# Patient Record
Sex: Female | Born: 1985 | Race: White | Hispanic: No | Marital: Married | Smoking: Never smoker
Health system: Southern US, Community
[De-identification: ages and names within clinical notes are randomized; demographics above are authoritative.]

## PROBLEM LIST (undated history)

## (undated) DIAGNOSIS — E119 Type 2 diabetes mellitus without complications: Secondary | ICD-10-CM

## (undated) DIAGNOSIS — J45909 Unspecified asthma, uncomplicated: Secondary | ICD-10-CM

## (undated) DIAGNOSIS — D649 Anemia, unspecified: Secondary | ICD-10-CM

## (undated) DIAGNOSIS — E559 Vitamin D deficiency, unspecified: Secondary | ICD-10-CM

## (undated) DIAGNOSIS — H547 Unspecified visual loss: Secondary | ICD-10-CM

## (undated) DIAGNOSIS — Z331 Pregnant state, incidental: Secondary | ICD-10-CM

## (undated) DIAGNOSIS — J302 Other seasonal allergic rhinitis: Secondary | ICD-10-CM

## (undated) DIAGNOSIS — R112 Nausea with vomiting, unspecified: Secondary | ICD-10-CM

## (undated) HISTORY — PX: APPENDECTOMY: SHX54

## (undated) HISTORY — PX: WISDOM TOOTH EXTRACTION: SHX21

## (undated) HISTORY — DX: Other seasonal allergic rhinitis: J30.2

## (undated) HISTORY — DX: Nausea with vomiting, unspecified: R11.2

## (undated) HISTORY — DX: Anemia, unspecified: D64.9

---
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
## 2005-01-01 ENCOUNTER — Emergency Department (HOSPITAL_COMMUNITY): Admission: EM | Admit: 2005-01-01 | Discharge: 2005-01-01 | Payer: Self-pay | Admitting: Emergency Medicine

## 2005-10-16 HISTORY — PX: OTHER SURGICAL HISTORY: SHX169

## 2010-10-16 DIAGNOSIS — F53 Postpartum depression: Secondary | ICD-10-CM

## 2010-10-16 DIAGNOSIS — Z9289 Personal history of other medical treatment: Secondary | ICD-10-CM

## 2010-10-16 HISTORY — DX: Postpartum depression: F53.0

## 2010-10-16 HISTORY — DX: Personal history of other medical treatment: Z92.89

## 2013-10-16 DIAGNOSIS — J45990 Exercise induced bronchospasm: Secondary | ICD-10-CM

## 2013-10-16 DIAGNOSIS — R51 Headache: Secondary | ICD-10-CM

## 2013-10-16 DIAGNOSIS — L309 Dermatitis, unspecified: Secondary | ICD-10-CM

## 2013-10-16 HISTORY — DX: Exercise induced bronchospasm: J45.990

## 2013-10-16 HISTORY — DX: Dermatitis, unspecified: L30.9

## 2013-10-16 HISTORY — DX: Headache: R51

## 2013-10-16 NOTE — L&D Delivery Note (Signed)
Delivery Information for  Boy Kinzlee Selvy    Labor Events:    Preterm labor:     Cervical ripening:                         Rupture date:     Rupture time:     Rupture type: Intact   Fluid Color:     Fluid Odor         Risk Factors:                                             Delivery Complications:                                                 Vaginal Delivery Counts    Initial Count Correct?:      Next Count Correct?:      Relief Count Correct?:      Other Count Correct?:     Final Count Correct?:          Presentation/Position     Vertex                   Delivery (Newborn)    FHR in O.R.: 142     Delivery Rm Temp:     Date of birth: 06/30/2015   Time of birth: 9:52 AM   Sex: female   GA: Gestational Age: [redacted]w[redacted]d   Delivery Type: Cesarean         C/S Incidence:  Repeat      C/S Incision Type:  Lower Uterine Transverse      C/S Incision Type Other:  uterine inc: 0950      VBAC Attempted:  No      VBAC Successful:  No     Delivery Assist by:     Number of pulls:     Vacuum Type:     Forceps Type:         Delivery Maneuvers    Non-Dystocia Maneuver:                            :         Delivery (Maternal)    Episiotomy: None   Lacerations: None     Laceration Type:      Episiotomy/Laceration Repair: N/A   Blood Loss (mL):     Uterus Explored:     Anesthesia Method: Spinal            Newborn Assessment    Living at birth: Yes          APGARS  One minute Five minutes Ten minutes   Skin color: 1   1       Heart rate: 2   2       Grimace: 2   2       Muscle tone: 2   2       Breathing: 2   2       Totals: 9  9              Resuscitation    Resuscitation: Tactile Stimulation  Suction: Bulb     Prior to DEL of chest: No         Newborn Measurements    Weight: 8 lb 5.3 oz (3780 g)   Length: 20.25"   Observed Anomalies:        Cord Information    Vessels: 3 vessels   Complications: None   Nuchal Cord:     Cut before DEL of shoulder:     Blood gases sent? No   Cord Tissue Sample: No         Placenta Information     Placenta Removal: Manual removal     Placenta Appearance: Intact       Culture/Pathology    Cultures obtained: None          Pathology Specimens: None                    Delivery Information for  Boy Cinde Ebert    Labor Events:    Preterm labor:     Cervical ripening:                         Rupture date:     Rupture time:     Rupture type: Intact   Fluid Color:     Fluid Odor         Risk Factors:                                             Delivery Complications:                                                 Vaginal Delivery Counts    Initial Count Correct?:      Next Count Correct?:      Relief Count Correct?:      Other Count Correct?:     Final Count Correct?:          Presentation/Position     Vertex                   Delivery (Newborn)    FHR in O.R.: 142     Delivery Rm Temp:     Date of birth: 06/30/2015   Time of birth: 9:52 AM   Sex: female   GA: Gestational Age: [redacted]w[redacted]d   Delivery Type: Cesarean         C/S Incidence:  Repeat      C/S Incision Type:  Lower Uterine Transverse      C/S Incision Type Other:  uterine inc: 0950      VBAC Attempted:  No      VBAC Successful:  No     Delivery Assist by:     Number of pulls:     Vacuum Type:     Forceps Type:         Delivery Maneuvers    Non-Dystocia Maneuver:                            :         Delivery (Maternal)    Episiotomy: None   Lacerations: None     Laceration Type:  Episiotomy/Laceration Repair: N/A   Blood Loss (mL):     Uterus Explored:     Anesthesia Method: Spinal            Newborn Assessment    Living at birth: Yes          APGARS  One minute Five minutes Ten minutes   Skin color: 1   1       Heart rate: 2   2       Grimace: 2   2       Muscle tone: 2   2       Breathing: 2   2       Totals: 9  9              Resuscitation    Resuscitation: Tactile Stimulation          Suction: Bulb     Prior to DEL of chest: No         Newborn Measurements    Weight: 8 lb 5.3 oz (3780 g)   Length: 20.25"   Observed Anomalies:        Cord Information     Vessels: 3 vessels   Complications: None   Nuchal Cord:     Cut before DEL of shoulder:     Blood gases sent? No   Cord Tissue Sample: No         Placenta Information    Placenta Removal: Manual removal     Placenta Appearance: Intact       Culture/Pathology    Cultures obtained: None          Pathology Specimens: None

## 2014-04-13 LAB — RUBELLA ANTIBODY, IGG: Rubella AB, IgG: IMMUNE

## 2014-04-13 LAB — PLATELET COUNT: Platelets: 216 (ref 140–400)

## 2014-04-13 LAB — HEMOGLOBIN AND HEMATOCRIT, BLOOD
Hematocrit: 33.7 % — AB (ref 36.0–46.0)
Hgb: 11.3 — AB (ref 11.5–16.0)

## 2014-05-13 ENCOUNTER — Encounter (HOSPITAL_BASED_OUTPATIENT_CLINIC_OR_DEPARTMENT_OTHER): Payer: Self-pay

## 2014-05-18 ENCOUNTER — Other Ambulatory Visit (HOSPITAL_BASED_OUTPATIENT_CLINIC_OR_DEPARTMENT_OTHER): Payer: Self-pay | Admitting: Obstetrics & Gynecology

## 2014-05-18 DIAGNOSIS — O09899 Supervision of other high risk pregnancies, unspecified trimester: Secondary | ICD-10-CM

## 2014-06-01 ENCOUNTER — Institutional Professional Consult (permissible substitution) (HOSPITAL_BASED_OUTPATIENT_CLINIC_OR_DEPARTMENT_OTHER)

## 2014-06-01 ENCOUNTER — Institutional Professional Consult (permissible substitution): Payer: Self-pay | Admitting: Obstetrics & Gynecology

## 2014-06-01 ENCOUNTER — Inpatient Hospital Stay (HOSPITAL_BASED_OUTPATIENT_CLINIC_OR_DEPARTMENT_OTHER): Admission: RE | Admit: 2014-06-01 | Source: Ambulatory Visit

## 2014-06-09 LAB — GROUP B STREP TRANSCRIBED: GBS Transcribed: NEGATIVE

## 2014-06-26 ENCOUNTER — Ambulatory Visit (HOSPITAL_BASED_OUTPATIENT_CLINIC_OR_DEPARTMENT_OTHER): Payer: Self-pay

## 2014-06-29 ENCOUNTER — Encounter (HOSPITAL_BASED_OUTPATIENT_CLINIC_OR_DEPARTMENT_OTHER): Payer: Self-pay

## 2014-06-30 ENCOUNTER — Encounter (HOSPITAL_BASED_OUTPATIENT_CLINIC_OR_DEPARTMENT_OTHER)
Admission: RE | Disposition: A | Discharge status: Home / Self Care | Payer: Self-pay | Source: Ambulatory Visit | Attending: Obstetrics & Gynecology

## 2014-06-30 ENCOUNTER — Inpatient Hospital Stay (HOSPITAL_BASED_OUTPATIENT_CLINIC_OR_DEPARTMENT_OTHER): Admitting: Obstetrics & Gynecology

## 2014-06-30 ENCOUNTER — Inpatient Hospital Stay (HOSPITAL_BASED_OUTPATIENT_CLINIC_OR_DEPARTMENT_OTHER): Admitting: Anesthesiology

## 2014-06-30 ENCOUNTER — Inpatient Hospital Stay
Admission: RE | Admit: 2014-06-30 | Discharge: 2014-07-03 | DRG: 765 | Disposition: A | Discharge status: Home / Self Care | Source: Ambulatory Visit | Attending: Obstetrics & Gynecology | Admitting: Obstetrics & Gynecology

## 2014-06-30 ENCOUNTER — Encounter (HOSPITAL_BASED_OUTPATIENT_CLINIC_OR_DEPARTMENT_OTHER): Payer: Self-pay

## 2014-06-30 DIAGNOSIS — Z794 Long term (current) use of insulin: Secondary | ICD-10-CM

## 2014-06-30 DIAGNOSIS — Z3A38 38 weeks gestation of pregnancy: Secondary | ICD-10-CM

## 2014-06-30 DIAGNOSIS — J45990 Exercise induced bronchospasm: Secondary | ICD-10-CM | POA: Diagnosis present

## 2014-06-30 DIAGNOSIS — O34219 Maternal care for unspecified type scar from previous cesarean delivery: Principal | ICD-10-CM | POA: Diagnosis present

## 2014-06-30 DIAGNOSIS — E109 Type 1 diabetes mellitus without complications: Secondary | ICD-10-CM | POA: Diagnosis present

## 2014-06-30 DIAGNOSIS — O409XX0 Polyhydramnios, unspecified trimester, not applicable or unspecified: Secondary | ICD-10-CM | POA: Diagnosis present

## 2014-06-30 DIAGNOSIS — O2432 Unspecified pre-existing diabetes mellitus in childbirth: Secondary | ICD-10-CM

## 2014-06-30 DIAGNOSIS — O99892 Other specified diseases and conditions complicating childbirth: Secondary | ICD-10-CM | POA: Diagnosis present

## 2014-06-30 DIAGNOSIS — Z9641 Presence of insulin pump (external) (internal): Secondary | ICD-10-CM

## 2014-06-30 HISTORY — DX: Unspecified visual loss: H54.7

## 2014-06-30 HISTORY — DX: Pregnant state, incidental: Z33.1

## 2014-06-30 LAB — GLUCOSE WHOLE BLOOD - POCT
Whole Blood Glucose POCT: 129 mg/dL — ABNORMAL HIGH (ref 70–100)
Whole Blood Glucose POCT: 123 mg/dL — ABNORMAL HIGH (ref 70–100)

## 2014-06-30 LAB — CBC
Hematocrit: 34.6 % — ABNORMAL LOW (ref 37.0–47.0)
Hgb: 11.5 g/dL — ABNORMAL LOW (ref 12.0–16.0)
MCH: 30 pg (ref 28.0–32.0)
MCHC: 33.2 g/dL (ref 32.0–36.0)
MCV: 90.3 fL (ref 80.0–100.0)
MPV: 9.3 fL — ABNORMAL LOW (ref 9.4–12.3)
Nucleated RBC: 0 /100 WBC (ref 0–1)
Platelets: 185 10*3/uL (ref 140–400)
RBC: 3.83 10*6/uL — ABNORMAL LOW (ref 4.20–5.40)
RDW: 14 % (ref 12–15)
WBC: 7.17 10*3/uL (ref 3.50–10.80)

## 2014-06-30 LAB — TYPE AND SCREEN
AB Screen Gel: NEGATIVE
ABO Rh: A POS

## 2014-06-30 LAB — HEPATITIS B SURFACE ANTIGEN W/ REFLEX TO CONFIRMATION: Hepatitis B Surface Antigen: NONREACTIVE

## 2014-06-30 LAB — HEMOLYSIS INDEX: Hemolysis Index: 6 (ref 0–18)

## 2014-06-30 SURGERY — Surgical Case
Anesthesia: Regional | Site: Abdomen | Wound class: Clean Contaminated

## 2014-06-30 MED ORDER — MEASLES, MUMPS & RUBELLA VAC SC INJ
0.5000 mL | INJECTION | SUBCUTANEOUS | Status: DC | PRN
Start: 2014-06-30 — End: 2014-07-03

## 2014-06-30 MED ORDER — LACTATED RINGERS IV SOLN
INTRAVENOUS | Status: DC
Start: 2014-06-30 — End: 2014-07-01

## 2014-06-30 MED ORDER — ALUM & MAG HYDROXIDE-SIMETH 200-200-20 MG/5ML PO SUSP
30.0000 mL | Freq: Four times a day (QID) | ORAL | Status: DC | PRN
Start: 2014-06-30 — End: 2014-07-03

## 2014-06-30 MED ORDER — GLYCOPYRROLATE 0.2 MG/ML IJ SOLN
INTRAMUSCULAR | Status: AC
Start: 2014-06-30 — End: ?
  Filled 2014-06-30: qty 1

## 2014-06-30 MED ORDER — OXYCODONE-ACETAMINOPHEN 5-325 MG PO TABS
1.0000 | ORAL_TABLET | ORAL | Status: DC | PRN
Start: 2014-06-30 — End: 2014-07-03

## 2014-06-30 MED ORDER — MORPHINE SULFATE (PF) 1 MG/ML IJ SOLN
INTRAMUSCULAR | Status: AC
Start: 2014-06-30 — End: ?
  Filled 2014-06-30: qty 10

## 2014-06-30 MED ORDER — NALBUPHINE HCL 10 MG/ML IJ SOLN
INTRAMUSCULAR | Status: AC
Start: 2014-06-30 — End: ?
  Filled 2014-06-30: qty 1

## 2014-06-30 MED ORDER — EPHEDRINE SULFATE 50 MG/ML IJ SOLN
INTRAMUSCULAR | Status: AC
Start: 2014-06-30 — End: ?
  Filled 2014-06-30: qty 1

## 2014-06-30 MED ORDER — LANOLIN EX OINT
TOPICAL_OINTMENT | CUTANEOUS | Status: DC | PRN
Start: 2014-06-30 — End: 2014-07-03
  Filled 2014-06-30: qty 7

## 2014-06-30 MED ORDER — MORPHINE SULFATE (PF) 1 MG/ML IJ SOLN
INTRAMUSCULAR | Status: DC | PRN
Start: 2014-06-30 — End: 2014-06-30
  Administered 2014-06-30: 2.1 mL via INTRASPINAL

## 2014-06-30 MED ORDER — DOCUSATE SODIUM 100 MG PO CAPS
200.0000 mg | ORAL_CAPSULE | Freq: Two times a day (BID) | ORAL | Status: DC | PRN
Start: 2014-06-30 — End: 2014-07-03
  Administered 2014-06-30 – 2014-07-01 (×2): 200 mg via ORAL
  Filled 2014-06-30 (×4): qty 2

## 2014-06-30 MED ORDER — FENTANYL CITRATE 0.05 MG/ML IJ SOLN
25.0000 ug | INTRAMUSCULAR | Status: DC | PRN
Start: 2014-06-30 — End: 2014-06-30

## 2014-06-30 MED ORDER — OXYTOCIN 30UNITS IN LR 500 ML PP--OUTSOURCED
7.5000 [IU]/h | INTRAVENOUS | Status: AC
Start: 2014-06-30 — End: 2014-06-30

## 2014-06-30 MED ORDER — TETANUS-DIPHTH-ACELL PERTUSSIS 5-2.5-18.5 LF-MCG/0.5 IM SUSP
0.5000 mL | INTRAMUSCULAR | Status: DC | PRN
Start: 2014-06-30 — End: 2014-07-03

## 2014-06-30 MED ORDER — SODIUM CHLORIDE 0.9 % IJ SOLN
3.0000 mL | Freq: Three times a day (TID) | INTRAMUSCULAR | Status: DC
Start: 2014-07-02 — End: 2014-07-01

## 2014-06-30 MED ORDER — NALBUPHINE HCL 10 MG/ML IJ SOLN
5.0000 mg | INTRAMUSCULAR | Status: DC | PRN
Start: 2014-06-30 — End: 2014-07-03
  Administered 2014-06-30 (×2): 5 mg via SUBCUTANEOUS
  Filled 2014-06-30: qty 1

## 2014-06-30 MED ORDER — MEPERIDINE HCL 25 MG/ML IJ SOLN
12.5000 mg | INTRAMUSCULAR | Status: DC | PRN
Start: 2014-06-30 — End: 2014-06-30

## 2014-06-30 MED ORDER — IBUPROFEN 600 MG PO TABS
600.0000 mg | ORAL_TABLET | Freq: Four times a day (QID) | ORAL | Status: DC | PRN
Start: 2014-06-30 — End: 2014-07-03
  Filled 2014-06-30: qty 1

## 2014-06-30 MED ORDER — LACTATED RINGERS IV SOLN
125.0000 mL/h | INTRAVENOUS | Status: DC
Start: 2014-06-30 — End: 2014-07-01
  Administered 2014-06-30: 125 mL/h via INTRAVENOUS

## 2014-06-30 MED ORDER — HYDROMORPHONE HCL PF 1 MG/ML IJ SOLN
0.5000 mg | INTRAMUSCULAR | Status: DC | PRN
Start: 2014-06-30 — End: 2014-06-30

## 2014-06-30 MED ORDER — MISOPROSTOL 200 MCG PO TABS
800.0000 ug | ORAL_TABLET | Freq: Once | ORAL | Status: DC | PRN
Start: 2014-06-30 — End: 2014-06-30

## 2014-06-30 MED ORDER — CEFAZOLIN SODIUM-DEXTROSE 2-3 GM-% IV SOLR
INTRAVENOUS | Status: AC
Start: 2014-06-30 — End: 2014-06-30
  Filled 2014-06-30: qty 50

## 2014-06-30 MED ORDER — ACETAMINOPHEN 650 MG RE SUPP
650.0000 mg | Freq: Four times a day (QID) | RECTAL | Status: DC | PRN
Start: 2014-06-30 — End: 2014-07-03

## 2014-06-30 MED ORDER — FAMOTIDINE 10 MG/ML IV SOLN (WRAP)
INTRAVENOUS | Status: DC | PRN
Start: 2014-06-30 — End: 2014-06-30
  Administered 2014-06-30: 20 mg via INTRAVENOUS

## 2014-06-30 MED ORDER — EPHEDRINE SULFATE 50 MG/ML IJ SOLN
INTRAMUSCULAR | Status: DC | PRN
Start: 2014-06-30 — End: 2014-06-30
  Administered 2014-06-30 (×2): 10 mg via INTRAVENOUS

## 2014-06-30 MED ORDER — ON-Q PUMP SINGLE FLOW
4.0000 mL/h | Status: DC
Start: 2014-06-30 — End: 2014-07-03
  Administered 2014-06-30: 4 mL/h via PERCUTANEOUS
  Filled 2014-06-30: qty 330

## 2014-06-30 MED ORDER — CEFAZOLIN SODIUM-DEXTROSE 2-3 GM-% IV SOLR
2.0000 g | Freq: Three times a day (TID) | INTRAVENOUS | Status: DC
Start: 2014-06-30 — End: 2014-06-30
  Administered 2014-06-30: 2 g via INTRAVENOUS

## 2014-06-30 MED ORDER — LACTATED RINGERS IV SOLN
INTRAVENOUS | Status: DC | PRN
Start: 2014-06-30 — End: 2014-06-30

## 2014-06-30 MED ORDER — ONDANSETRON HCL 4 MG/2ML IJ SOLN
INTRAMUSCULAR | Status: AC
Start: 2014-06-30 — End: ?
  Filled 2014-06-30: qty 2

## 2014-06-30 MED ORDER — OXYTOCIN 10 UNIT/ML IJ SOLN
INTRAMUSCULAR | Status: AC
Start: 2014-06-30 — End: ?
  Filled 2014-06-30: qty 2

## 2014-06-30 MED ORDER — ACETAMINOPHEN 325 MG PO TABS
325.0000 mg | ORAL_TABLET | ORAL | Status: DC | PRN
Start: 2014-06-30 — End: 2014-07-03

## 2014-06-30 MED ORDER — PROMETHAZINE HCL 25 MG/ML IJ SOLN
3.1250 mg | Freq: Once | INTRAMUSCULAR | Status: AC | PRN
Start: 2014-06-30 — End: 2014-06-30
  Administered 2014-06-30: 3.125 mg via INTRAVENOUS

## 2014-06-30 MED ORDER — HYDROCORTISONE 1 % EX OINT
TOPICAL_OINTMENT | Freq: Three times a day (TID) | CUTANEOUS | Status: DC | PRN
Start: 2014-06-30 — End: 2014-07-03

## 2014-06-30 MED ORDER — WITCH HAZEL-GLYCERIN EX PADS
1.0000 | MEDICATED_PAD | CUTANEOUS | Status: DC | PRN
Start: 2014-06-30 — End: 2014-07-03

## 2014-06-30 MED ORDER — ONDANSETRON 4 MG PO TBDP
4.0000 mg | ORAL_TABLET | Freq: Four times a day (QID) | ORAL | Status: DC | PRN
Start: 2014-06-30 — End: 2014-07-03

## 2014-06-30 MED ORDER — PRENATAL AD PO TABS
1.0000 | ORAL_TABLET | Freq: Every day | ORAL | Status: DC
Start: 2014-06-30 — End: 2014-07-03
  Filled 2014-06-30 (×2): qty 1

## 2014-06-30 MED ORDER — OXYCODONE HCL 5 MG PO TABS
5.0000 mg | ORAL_TABLET | Freq: Four times a day (QID) | ORAL | Status: DC | PRN
Start: 2014-06-30 — End: 2014-07-03
  Administered 2014-06-30: 5 mg via ORAL

## 2014-06-30 MED ORDER — FENTANYL CITRATE 0.05 MG/ML IJ SOLN
INTRAMUSCULAR | Status: AC
Start: 2014-06-30 — End: ?
  Filled 2014-06-30: qty 2

## 2014-06-30 MED ORDER — ACETAMINOPHEN 325 MG PO TABS
650.0000 mg | ORAL_TABLET | Freq: Four times a day (QID) | ORAL | Status: DC | PRN
Start: 2014-06-30 — End: 2014-07-03

## 2014-06-30 MED ORDER — METHYLERGONOVINE MALEATE 0.2 MG/ML IJ SOLN
200.0000 ug | INTRAMUSCULAR | Status: DC | PRN
Start: 2014-06-30 — End: 2014-06-30

## 2014-06-30 MED ORDER — SIMETHICONE 80 MG PO CHEW
160.0000 mg | CHEWABLE_TABLET | Freq: Three times a day (TID) | ORAL | Status: DC | PRN
Start: 2014-06-30 — End: 2014-07-03

## 2014-06-30 MED ORDER — ONDANSETRON HCL 4 MG/2ML IJ SOLN
4.0000 mg | Freq: Four times a day (QID) | INTRAMUSCULAR | Status: DC | PRN
Start: 2014-06-30 — End: 2014-07-03

## 2014-06-30 MED ORDER — LACTATED RINGERS IV BOLUS
1000.0000 mL | Freq: Once | INTRAVENOUS | Status: AC
Start: 2014-06-30 — End: 2014-06-30
  Administered 2014-06-30: 1000 mL via INTRAVENOUS

## 2014-06-30 MED ORDER — ONDANSETRON HCL 4 MG/2ML IJ SOLN
4.0000 mg | Freq: Once | INTRAMUSCULAR | Status: AC | PRN
Start: 2014-06-30 — End: 2014-06-30
  Administered 2014-06-30: 4 mg via INTRAVENOUS

## 2014-06-30 MED ORDER — NALOXONE HCL 0.4 MG/ML IJ SOLN
0.1000 mg | INTRAMUSCULAR | Status: DC | PRN
Start: 2014-06-30 — End: 2014-07-03

## 2014-06-30 MED ORDER — IBUPROFEN 600 MG PO TABS
600.0000 mg | ORAL_TABLET | Freq: Four times a day (QID) | ORAL | Status: DC
Start: 2014-06-30 — End: 2014-07-03
  Administered 2014-06-30 – 2014-07-03 (×14): 600 mg via ORAL
  Filled 2014-06-30 (×14): qty 1

## 2014-06-30 MED ORDER — OXYCODONE HCL 5 MG PO TABS
ORAL_TABLET | ORAL | Status: AC
Start: 2014-06-30 — End: ?
  Filled 2014-06-30: qty 1

## 2014-06-30 MED ORDER — OXYTOCIN 30UNITS IN LR 500 ML PP--OUTSOURCED
INTRAVENOUS | Status: AC
Start: 2014-06-30 — End: ?
  Filled 2014-06-30: qty 500

## 2014-06-30 MED ORDER — OXYCODONE-ACETAMINOPHEN 5-325 MG PO TABS
2.0000 | ORAL_TABLET | ORAL | Status: DC | PRN
Start: 2014-06-30 — End: 2014-07-03

## 2014-06-30 MED ORDER — PHENYLEPHRINE HCL 10 MG/ML IJ SOLN
INTRAMUSCULAR | Status: DC | PRN
Start: 2014-06-30 — End: 2014-06-30
  Administered 2014-06-30 (×2): 100 ug via INTRAVENOUS

## 2014-06-30 MED ORDER — BENZOCAINE-MENTHOL 20-0.5 % EX AERO
1.0000 | INHALATION_SPRAY | CUTANEOUS | Status: DC | PRN
Start: 2014-06-30 — End: 2014-07-03

## 2014-06-30 MED ORDER — OXYTOCIN 30UNITS IN LR 500 ML PP--OUTSOURCED
7.5000 [IU]/h | INTRAVENOUS | Status: AC
Start: 2014-06-30 — End: 2014-06-30
  Administered 2014-06-30: 7.5 [IU]/h via INTRAVENOUS

## 2014-06-30 MED ORDER — MISOPROSTOL 200 MCG PO TABS
ORAL_TABLET | ORAL | Status: DC | PRN
Start: 2014-06-30 — End: 2014-06-30
  Administered 2014-06-30: 800 ug via INTRAUTERINE

## 2014-06-30 MED ORDER — PROMETHAZINE HCL 25 MG/ML IJ SOLN
INTRAMUSCULAR | Status: AC
Start: 2014-06-30 — End: ?
  Filled 2014-06-30: qty 1

## 2014-06-30 MED ORDER — OXYTOCIN 10 UNIT/ML IJ SOLN
INTRAMUSCULAR | Status: DC | PRN
Start: 2014-06-30 — End: 2014-06-30
  Administered 2014-06-30: 20 [IU] via INTRAVENOUS

## 2014-06-30 MED ORDER — BISACODYL 10 MG RE SUPP
10.0000 mg | Freq: Every day | RECTAL | Status: DC | PRN
Start: 2014-06-30 — End: 2014-07-03

## 2014-06-30 MED ORDER — FAMOTIDINE 20 MG/2ML IV SOLN
INTRAVENOUS | Status: AC
Start: 2014-06-30 — End: ?
  Filled 2014-06-30: qty 2

## 2014-06-30 MED ORDER — ONDANSETRON HCL 4 MG/2ML IJ SOLN
INTRAMUSCULAR | Status: DC | PRN
Start: 2014-06-30 — End: 2014-06-30
  Administered 2014-06-30: 4 mg via INTRAVENOUS

## 2014-06-30 MED ORDER — BUPIVACAINE HCL (PF) 0.5 % IJ SOLN
INTRAMUSCULAR | Status: AC
Start: 2014-06-30 — End: 2014-06-30
  Filled 2014-06-30: qty 10

## 2014-06-30 MED ORDER — METHYLERGONOVINE MALEATE 0.2 MG/ML IJ SOLN
200.0000 ug | Freq: Once | INTRAMUSCULAR | Status: DC | PRN
Start: 2014-06-30 — End: 2014-06-30

## 2014-06-30 SURGICAL SUPPLY — 15 items
CATH ON-Q EXPANSION 19.1CM (Catheter) ×1 IMPLANT
CLEANER ELECTROSURGICAL TIP PENCIL (Procedure Accessories) ×1
CLEANER ELECTROSURGICAL TIP PENCIL HANDSWITCH LECTROBRASIVE (Procedure Accessories) ×1 IMPLANT
CLEANER ESURG TIP PNCL LCTRBRS STRL (Procedure Accessories) ×1
DRESSING ISLAND TELFA 4X10 (Dressing) ×3 IMPLANT
GLOVE SURG BIOGEL PF LTX SZ7.0 (Glove) ×3 IMPLANT
MARKER SKIN (Positioning Supplies) ×3 IMPLANT
PAD ELECTROSRG GRND REM W CRD (Procedure Accessories) ×3 IMPLANT
PAD VALLEYLAB SCRATCH 5.08CM (Procedure Accessories) ×1
PENCIL ELECTRO PUSH BUTTON (Cautery) ×3 IMPLANT
SLEEVE CMPR MED KN LGTH KDL SCD 21- IN (Sleeve) ×2
SLEEVE COMPRESSION MEDIUM KNEE LENGTH KENDALL SEQUENTIAL OD21- IN (Sleeve) ×1 IMPLANT
SLEEVE SEQ COMP KNEE REGULAR (Sleeve) ×1
SUT MONOCRYL O CTX (Suture) ×6 IMPLANT
SUTURE VICRYL 0-0 CT1 (Suture) ×6 IMPLANT

## 2014-06-30 NOTE — Anesthesia Preprocedure Evaluation (Addendum)
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      pertinent labs reviewed

## 2014-06-30 NOTE — Plan of Care (Signed)
Problem: C-Section Pre/Intra-Op/Recovery Care  Goal: Tissue perfusion is adequate  Outcome: Progressing

## 2014-06-30 NOTE — Plan of Care (Signed)
Problem: C-Section Pre/Intra-Op/Recovery Care  Goal: Dressing intact during recovery with any drainage marked.  Outcome: Progressing

## 2014-06-30 NOTE — Plan of Care (Signed)
Problem: C-Section Pre/Intra-Op/Recovery Care  Goal: Manages discomfort.  Outcome: Progressing

## 2014-06-30 NOTE — PACU (Signed)
Patient reattached insulin pump.

## 2014-06-30 NOTE — Plan of Care (Signed)
Problem: C-Section Pre/Intra-Op/Recovery Care  Goal: Postpartum hemorrhage is prevented or managed promptly  Outcome: Progressing    Problem: Cesarean and Vaginal Delivery-Postpartum Care  Goal: Postpartum hemorrhage is prevented or managed promptly  Outcome: Progressing

## 2014-06-30 NOTE — Plan of Care (Signed)
Problem: C-Section Pre/Intra-Op/Recovery Care  Goal: Airway is maintained  Outcome: Progressing

## 2014-06-30 NOTE — Plan of Care (Signed)
Problem: C-Section Pre/Intra-Op/Recovery Care  Goal: Patient will maintain Adequate Oxygenation  Outcome: Progressing

## 2014-06-30 NOTE — Plan of Care (Signed)
Routine c/s reviewed with patient and husband.  No questions at this time.

## 2014-06-30 NOTE — Transfer of Care (Signed)
Anesthesia Transfer of Care Note    Patient: Allison Salazar    Procedures performed: Procedure(s):  CESAREAN SECTION    Anesthesia type: Spinal    Patient location:PACU    Last vitals:   Filed Vitals:    06/30/14 0832   BP: 113/67   Pulse: 70   Temp:        Post pain: Patient not complaining of pain, continue current therapy      Mental Status:awake    Respiratory Function: tolerating room air    Cardiovascular: stable    Nausea/Vomiting: patient not complaining of nausea or vomiting    Hydration Status: adequate    Post assessment: no apparent anesthetic complications and no reportable events     Report given and care assumed by PACU RN, questions addressed.  Patient stable. BP 108/61, HR 85, Temp 97.7, SpO2 99%, RR 16    Dr. Joselyn Glassman [phone]

## 2014-06-30 NOTE — Op Note (Signed)
Cesarean Section Procedure Note    Indications: Previous cesarean x 2; IDDM; polyhydramnios    Pre-operative Diagnosis: 38 week 5 day pregnancy.    Post-operative Diagnosis: same    Procedure:  Repeat low segment cesarean section    Surgeon: Loistine Chance     Assistants: Hansel Starling, FSA    Anesthesia: Spinal anesthesia    ASA Class: 3    Procedure Details   The patient was seen in the Holding Room. The risks, benefits, complications, treatment options, and expected outcomes were discussed with the patient.  The patient concurred with the proposed plan, giving informed consent.  The site of surgery properly noted/marked. The patient was taken to Operating Room # H, identified as Allison Salazar and the procedure verified as C-Section Delivery. A Time Out was held and the above information confirmed.    After induction of anesthesia, the patient was draped and prepped in the usual sterile manner. A Pfannenstiel incision was made and carried down through the subcutaneous tissue to the fascia. Fascial incision was made and extended transversely. The fascia was separated from the underlying rectus tissue superiorly and inferiorly. The peritoneum was identified and entered. Peritoneal incision was extended longitudinally. The utero-vesical peritoneal reflection was incised transversely and the bladder flap was bluntly freed from the lower uterine segment. A low transverse uterine incision was made. Delivered from VTX presentation was baby boy   with Apgar scores of 9 at one minute and 9 at five minutes. After the umbilical cord was clamped and cut cord blood was obtained for evaluation. The placenta was removed intact and appeared normal. The uterine outline, tubes and ovaries appeared normal. The uterine incision was closed with running locked sutures of 0 monocryl. Hemostasis was observed. Lavage was carried out until clear. The fascia was then reapproximated with running sutures of 0 Vicryl. The skin was  reapproximated with monocryl.    Instrument, sponge, and needle counts were correct prior the abdominal closure and at the conclusion of the case.     Findings:  Female infant  Apgars 9-9  Wt. : 8 lbs 5 oz  (3780 grams)  Clear fluid  Normal tubes ovaries and uterus    Estimated Blood Loss:  700 ml           Drains: foley to garvity           Total IV Fluids: 1000 ml           Specimens: none           Implants: ON - Q pump           Complications:  None; patient tolerated the procedure well.           Disposition: PACU - hemodynamically stable.           Condition: stable    Attending Attestation: I was present and scrubbed for the entire procedure.

## 2014-06-30 NOTE — Anesthesia Postprocedure Evaluation (Signed)
Anesthesia Post Evaluation    Patient: Allison Salazar    Procedures performed: Procedure(s):  CESAREAN SECTION    Anesthesia type: Spinal    Patient location:Postpartum Ward    Last vitals:   Filed Vitals:    06/30/14 1226   BP: 110/68   Pulse: 82   Temp: 36.7 C (98.1 F)   Resp: 17   SpO2: 100%       Post pain: Patient not complaining of pain, continue current therapy      Mental Status:awake    Respiratory Function: tolerating room air    Cardiovascular: see RN record for VS    Nausea/Vomiting: patient not complaining of nausea or vomiting    Hydration Status: see record for VS    Post assessment: no apparent anesthetic complications and no reportable events     Dr. Joselyn Glassman [phone]

## 2014-06-30 NOTE — H&P (Signed)
Subjective:   Allison Salazar is a 28 y.o. G5 P2 female with EDC [birth year]/09[birth year]2014 at 38 and 5/[redacted] weeks gestation who is being admitted for C-section.  Her current obstetrical history is significant for IDDM and previous cesarean.  Patient reports occasional contractions.   Fetal Movement: normal.   Past Medical History   Diagnosis Date   . Asthma, exercise induced 2015     NO inhaler use 93yrs   . History of blood transfusion 2012     long labor, atony   . Headache(784.0) 2015     occ migraine   . PONV (postoperative nausea and vomiting)    . Postpartum depression 2012     zoloft   . Impaired vision      contacts   . Eczema 2015   . Diabetes mellitus 1989     insulin pump   . Pregnant state, incidental    .        Objective:     Vital signs in last 24 hours:  Temp:  [97.8 F (36.6 C)] 97.8 F (36.6 C)  Heart Rate:  [70-74] 70  BP: (102-113)/(66-67) 113/67 mmHg      General:   alert, appears stated age and cooperative   Skin:   normal   HEENT:  NCAT   Lungs:   clear to auscultation bilaterally   Heart:   regular rate and rhythm   Breasts:   ---   Abdomen:  gravid   Pelvis:  External genitalia: normal general appearance   FHT:  reactive BPM   Uterine Size: size equals dates   Presentations: cephalic   Cervix:     Dilation: Closed    Effacement: 50%    Station:  Floating    Consistency: medium    Position: posterior     Lab Review   A, Rh+, Rubella-immune, Hepatitis B surface antigen non-reactive, GBS negative       Assessment/Plan:   38 and 5/[redacted] weeks gestation.  Not in labor.  Obstetrical history significant for IDDM and previous cesarean .       Risks, benefits, alternatives and possible complications have been discussed in detail with the patient.  Pre-admission, admission, and post admission procedures and expectations were discussed in detail.  All questions answered, all appropriate consents will be signed at the Hospital. Admission is planned for today.   Plan for cesarean now.

## 2014-06-30 NOTE — Plan of Care (Signed)
Problem: C-Section Pre/Intra-Op/Recovery Care  Goal: Vital signs and assessments are within defined parameters.  Outcome: Not Progressing    Problem: Cesarean and Vaginal Delivery-Postpartum Care  Goal: Vital signs and assessments are within defined parameters.  Outcome: Not Progressing

## 2014-07-01 ENCOUNTER — Encounter (HOSPITAL_BASED_OUTPATIENT_CLINIC_OR_DEPARTMENT_OTHER): Payer: Self-pay | Admitting: Obstetrics & Gynecology

## 2014-07-01 LAB — RPR: RPR: NONREACTIVE

## 2014-07-01 LAB — CBC AND DIFFERENTIAL
Basophils Absolute Automated: 0.02 10*3/uL (ref 0.00–0.20)
Basophils Automated: 0 %
Eosinophils Absolute Automated: 0.04 10*3/uL (ref 0.00–0.70)
Eosinophils Automated: 0 %
Hematocrit: 28.2 % — ABNORMAL LOW (ref 37.0–47.0)
Hgb: 9.2 g/dL — ABNORMAL LOW (ref 12.0–16.0)
Immature Granulocytes Absolute: 0.02 10*3/uL
Immature Granulocytes: 0 %
Lymphocytes Absolute Automated: 1.34 10*3/uL (ref 0.50–4.40)
Lymphocytes Automated: 16 %
MCH: 30.1 pg (ref 28.0–32.0)
MCHC: 32.6 g/dL (ref 32.0–36.0)
MCV: 92.2 fL (ref 80.0–100.0)
MPV: 9.5 fL (ref 9.4–12.3)
Monocytes Absolute Automated: 0.59 10*3/uL (ref 0.00–1.20)
Monocytes: 7 %
Neutrophils Absolute: 6.57 10*3/uL (ref 1.80–8.10)
Neutrophils: 77 %
Nucleated RBC: 0 /100 WBC (ref 0–1)
Platelets: 147 10*3/uL (ref 140–400)
RBC: 3.06 10*6/uL — ABNORMAL LOW (ref 4.20–5.40)
RDW: 14 % (ref 12–15)
WBC: 8.58 10*3/uL (ref 3.50–10.80)

## 2014-07-01 NOTE — Plan of Care (Signed)
Pt VSS. Pt has been up OOB and has been ambulating well around her room. Pt has been voiding well since having her foley out. IV has been removed. Bleeding has been WNL. Pt reports some pain but only has been wanting IBuprofen. Pt has had baby rooming in and has been breast feeding well with good reported latch. Pt has been monitoring her own blood sugar levels and has been instructed to report any <50 or >225 which she states she has not had at this time. Pt tolerating diet well. Pt will continue to be monitored for comfort as well as safety.

## 2014-07-01 NOTE — Lactation Note (Signed)
Initial Visit:  Gravida:  5   Para:   3          Delivery type: C/S  DOB and delivery time:[birth year] @ [id number]  Gestational age:29+    Breastfeeding and pumping experience:  1st baby (short frenulum) 16 months, 2nd baby 9 months (until this pregnancy  Initial lactation consult. Breastfeeding basics reviewed.   Encouraged to do skin to skin and breast feed with all cues (at least 8-12 times per 24 hours).   Avoid formula supplement unless it is medically indicated. Watch baby for signs of effective feedings (adequate number of voids, stools and minimal weight loss).   Contact number given - Patient to call for questions or assistance as needed.   Shown breast feeding section of Mother/Infant booklet and given outpatient lactation resource list.   Follow up in am.    No problems or concerns at this time.

## 2014-07-01 NOTE — Progress Notes (Addendum)
..  POD# 1 cesarean delivery.   No complaints   Vitals reviewed and stable..BP 93/57 mmHg  Pulse 82  Temp(Src) 97.6 F (36.4 C) (Oral)  Resp 17  Ht 1.626 m (5\' 4" )  Wt 77.111 kg (170 lb)  BMI 29.17 kg/m2  SpO2 98%  Breastfeeding? Yes   Incision CDI   Abdomen hypoactive bowel sounds   Fundus firm   Decreasing lochia   LE Trace  Routine postpartum care.  D/C plan in 1-2 days if well.

## 2014-07-01 NOTE — Progress Notes (Signed)
Anesthesia Department Obstetric Follow-up    Patient: Allison Salazar    Anesthesia type: Spinal    Post-operative pain managment: Subarachnoid Morphine    Complaints/Side Effects:  Itching--has resolved.    Last vitals:   Filed Vitals:    07/01/14 0929   BP: 93/54   Pulse: 83   Temp: 36.4 C (97.5 F)   Resp: 18   SpO2: 100%        Mental Status:awake and alert     Pain (assessment and plan):  Patient not complaining of pain, continue current therapy     Respiratory Function: tolerating room air    Nausea/Vomiting: patient not complaining of nausea or vomiting    Post assessment: no apparent anesthetic complications

## 2014-07-02 ENCOUNTER — Other Ambulatory Visit: Payer: Self-pay

## 2014-07-02 MED ORDER — OXYCODONE-ACETAMINOPHEN 5-325 MG PO TABS
1.0000 | ORAL_TABLET | ORAL | Status: DC | PRN
Start: 2014-07-02 — End: 2014-07-02

## 2014-07-02 MED ORDER — OXYCODONE HCL 5 MG PO TABS
5.0000 mg | ORAL_TABLET | Freq: Four times a day (QID) | ORAL | 0 refills | Status: DC | PRN
Start: 2014-07-02 — End: 2014-09-23
  Filled 2014-07-02: qty 50, 13d supply, fill #0

## 2014-07-02 MED ORDER — MUPIROCIN 2 % EX OINT
0 refills | Status: DC
Start: 2014-07-02 — End: 2015-08-11
  Filled 2014-07-02: qty 44, 15d supply, fill #0

## 2014-07-02 MED ORDER — IBUPROFEN 600 MG PO TABS
600.0000 mg | ORAL_TABLET | Freq: Four times a day (QID) | ORAL | 0 refills | Status: DC | PRN
Start: 2014-07-02 — End: 2014-09-23
  Filled 2014-07-02: qty 50, 13d supply, fill #0

## 2014-07-02 NOTE — Discharge Summary (Signed)
Obstetrics Discharge Summary    Delivery:  See Delivery Record    Puerperal Course: Normal    Discharge Examination: Normal    Diagnosis:  Term female infant delivered by C Section     Instructions: She has been given a discharge instruction sheet which includes precautions for activity level, follow-up and when to call the physician     Hoyt Leanos, MD  ID: [phone]  Pager: [phone]

## 2014-07-02 NOTE — Progress Notes (Addendum)
Subjective:  Postop Day 2: Cesarean Delivery    The patient feels well.    Pain is well controlled with current medications.  Urinary output is adequate.   The patient is ambulating well.   The patient is tolerating a normal diet.   Flatus has been passed.  Vaginal bleeding reduced.  Desires discharge home     Objective:  Vital signs in last 24 hours:  Temp:  [97.5 F (36.4 C)-98.1 F (36.7 C)] 98.1 F (36.7 C)  Heart Rate:  [83-97] 83  Resp Rate:  [16-18] 16  BP: (93-102)/(54-69) 97/67 mmHg      General:    alert   Bowel Sounds:  active       Uterine Fundus:   firm   Incision:  healing well, no significant drainage, no dehiscence, no significant erythema             Extremities:  non-tender,trace to 1+ bilateral pedal edema     Lab:  A positive  Hemoglobin: PreOp: 11  PostOp:9      Assessment/Plan:  Status post Cesarean section. Doing well postoperatively.   DM: blood sugars stable     Discharge home with standard precautions and return to clinic in 2 weeks        Lorna Few, MD  ID: [phone]  Pager: [phone]

## 2014-07-02 NOTE — Plan of Care (Signed)
Pt VSS. Pt has been up OOB and has been ambulating around the unit well. Pt has been receiving scheduled ibuprofen with good reported management of pain. Bleeding is WNL and U is firm and midline. Husband is at the bedside. Pt has been given new bed and reports much more comfort now. Pt has been breast feeding well with good reported latch today from mom. Baby has been started under photo therapy with all questions answered about treatment. Discharge has been moved until tomorrow. Pt will continue to be monitored for comfort as well as safety throughout the day.

## 2014-07-02 NOTE — Lactation Note (Signed)
Mom states that she has had difficulty getting baby back nursing well since the circumcision yesterday morning. During the night the baby refused to  Latch at all despite being awake and alert. He didn't fuss, but he wouldn't latch. Mom had concerns about baby's jaundice level, and decided to give him one formula feeding. She is pumping with HGP, but nothing is coming out on the left side. She was able to express about 20 ml of EBM from the right side, and she fed it to her baby. This morning, baby has latched and fed well. He is sleeping now. Reassured that baby should return to breast feeding well. Sore nipples - used APNO with last baby and feels it worked very well for her. RN called. She will ask Dr. For prescription for [facility]. Contact # on white board. Call for F/U as needed.

## 2014-07-02 NOTE — Plan of Care (Signed)
Patient is stable and without questions or concerns at this time. POC reviewed this shift and patient verbalizes understanding and continues to progress.   Patient pain level within patient stated goal: YES   Patient breastfeeding infant: YES   Patient pumping: NO   Patient lochia appropriate: YES   Foley out and voiding: yes  Abdominal incision clean, dry and well approximated: yes  Nutrition adequate for discharge: YES   Patient reports constipation: NO   Patient agrees to notify RN of any changes in her condition/status: YES

## 2014-07-03 NOTE — Plan of Care (Signed)
Pt stable, assessment WNL.  Pt discharged to home.  F/U scheduled in 2 weeks with OBGYN.  Discharge teaching complete, pt verbalizes understanding.

## 2014-07-03 NOTE — Progress Notes (Signed)
Patient not in room when rounding   No complaints from nursing staff.   OK for discharge today.    Lorna Few, MD  ID: [phone]  Pager: [phone]

## 2014-07-03 NOTE — Plan of Care (Signed)
Patient is stable and without questions or concerns at this time.  POC reviewed this shift and patient verbalizes understanding and continues to progress.    Patient pain level within patient stated goal: Yes  Patient breastfeeding infant: Yes  Patient pumping: Yes  Patient lochia appropriate: Yes, see doc flow  Foley out and voiding: Yes  Abdominal incision clean, dry and well approximated: Yes  Nutrition adequate for discharge: Yes  Patient reports constipation: No   Patient agrees to notify RN of any changes in her condition/status: Yes    Q-pump removed by MD.

## 2014-07-03 NOTE — Progress Notes (Signed)
Subjective:  Postop Day 3: Cesarean Delivery    The patient feels well.    Pain is well controlled with current medications.  Urinary output is adequate.   The patient is ambulating well.   The patient is tolerating a normal diet.   Flatus has been passed.  Vaginal bleeding reduced.  Desires discharge home     Objective:  Vital signs in last 24 hours:  Temp:  [97.9 F (36.6 C)] 97.9 F (36.6 C)  Heart Rate:  [77] 77  Resp Rate:  [16] 16  BP: (107)/(61) 107/61 mmHg      General:    alert   Bowel Sounds:  active       Uterine Fundus:   firm   Incision:  healing well, no significant drainage, no dehiscence, no significant erythema             Extremities:  non-tender,trace to 1+ bilateral pedal edema     Lab:  A positive  Hemoglobin: PreOp: 11  PostOp:9      Assessment/Plan:  Status post Cesarean section. Doing well postoperatively.   DM: blood sugars stable     Discharge home with standard precautions and return to clinic in 2 weeks        Lorna Few, MD  ID: [phone]  Pager: [phone]

## 2014-07-03 NOTE — Plan of Care (Signed)
Patient is stable and without questions or concerns at this time. POC reviewed this shift and patient verbalizes understanding and continues to progress.   Patient pain level within patient stated goal: YES   Patient breastfeeding infant: YES   Patient pumping: NO   Patient lochia appropriate: YES   Foley out and voiding: yes   Abdominal incision clean, dry and well approximated: yes   Nutrition adequate for discharge: YES   Patient reports constipation: NO   Patient agrees to notify RN of any changes in her condition/status: YES    On q pump intact

## 2014-07-12 ENCOUNTER — Telehealth: Payer: Self-pay | Admitting: Pediatrics

## 2014-09-14 ENCOUNTER — Encounter (INDEPENDENT_AMBULATORY_CARE_PROVIDER_SITE_OTHER): Payer: Self-pay | Admitting: Family Medicine

## 2014-09-14 ENCOUNTER — Ambulatory Visit (INDEPENDENT_AMBULATORY_CARE_PROVIDER_SITE_OTHER): Admitting: Family Medicine

## 2014-09-14 VITALS — BP 120/77 | HR 98 | Temp 98.1°F | Resp 16 | Ht 64.0 in | Wt 159.0 lb

## 2014-09-14 DIAGNOSIS — F53 Postpartum depression: Secondary | ICD-10-CM

## 2014-09-14 DIAGNOSIS — E109 Type 1 diabetes mellitus without complications: Secondary | ICD-10-CM

## 2014-09-14 DIAGNOSIS — Z135 Encounter for screening for eye and ear disorders: Secondary | ICD-10-CM

## 2014-09-14 DIAGNOSIS — Z1283 Encounter for screening for malignant neoplasm of skin: Secondary | ICD-10-CM

## 2014-09-14 NOTE — Progress Notes (Signed)
Subjective:       Patient ID: Allison Salazar is a 28 y.o. female.    HPI   Chief Complaint   Patient presents with   . Establish Care     establish PCP, possible post partum c/o lack of sleep and weird dreams increasing mood changes   . Diabetes     Type 1    new pt.   Recently moved  from [place]     T1DM - dx 1989.  Well controlled.   Last a1c 5.0, 5.2 .   Couple times a week - 60's,   On insulin pump, continues glucose monitoring -  "Animas" -- since 2005 hx of "Mini med" in past.   Few hypoglycemic episodes. alarm set at 80.   Carries apple juice,   Last hospitalized in high school - 2nd to malfunction of pump.    No recent  er visits,   No episodes of tremors, diaphoresis, decreased concentration, blurred vision, nor LOC  Denies any cp, palp, sob, hypoglycemic symptoms (sweats, dizziness, confusion, tremulousness) . No polyuria, polydipsia nor polyphagia.            Asthma 2003, since bout of pna,   2 puff alb inh  with exercise.  Hasn't needed to use inh in at least 8 yrs.   No hospitalizations for asthma.   Very well controlled.       Hx of Pp depression in 2012 . Was stable on Zoloft.  In past couple weeks recurrence has 2 1/2 mos old   Difficulty with insomnia 2nd to boys, breastfeeding.   No Si nor HI.     Also notes hx of skin lesions , both personal and family hx . would like a skin exam by derm.   The following portions of the patient's history were reviewed and updated as appropriate: allergies, current medications, past family history, past medical history, past social history, past surgical history and problem list.    Review of Systems        ROS: as in HPI       Patient Active Problem List   Diagnosis   . [redacted] weeks gestation of pregnancy     Allergies   Allergen Reactions   . Latex Dermatitis     Problem list and Allergies reviewed.     Past Medical History   Diagnosis Date   . Asthma, exercise induced 2015     NO inhaler use 86yrs   . History of blood transfusion 2012     long labor, atony   .  Headache(784.0) 2015     occ migraine   . PONV (postoperative nausea and vomiting)    . Postpartum depression 2012     zoloft   . Impaired vision      contacts   . Eczema 2015   . Diabetes mellitus 1989     insulin pump   . Pregnant state, incidental      Past Surgical History   Procedure Laterality Date   . Appendectomy  2005     scope   . Wisdom tooth extraction  2004   . Cesarean section  2012     FTP   . Cesarean section N/A 06/30/2014     Procedure: CESAREAN SECTION;  Surgeon: Loistine Chance, MD;  Location: [facility] LABOR OR;  Service: Obstetrics;  Laterality: N/A;     Family History   Problem Relation Age of Onset   . Heart attack Father 48   .  Heart disease Father    . Hypertension Father    . Kidney disease Paternal Aunt    . Hypertension Paternal Grandmother    . Asthma Neg Hx    . Cancer Neg Hx    . Diabetes Neg Hx    . Stomach cancer Neg Hx    . Stroke Neg Hx      History     Social History   . Marital Status: Married     Spouse Name: N/A     Number of Children: N/A   . Years of Education: N/A     Occupational History   . Not on file.     Social History Main Topics   . Smoking status: Never Smoker    . Smokeless tobacco: Not on file   . Alcohol Use: No   . Drug Use: No   . Sexual Activity:     Partners: Male     Birth Control/ Protection: Condom     Other Topics Concern   . Not on file     Social History Narrative     Current outpatient prescriptions: APNO nipple ointment, Use as directed, Disp: 44 g, Rfl: 0;  insulin lispro (HUMALOG) 100 UNIT/ML injection, Inject into the skin 3 (three) times daily before meals. Insulin pump, Disp: , Rfl: ;  Prenatal Vit-DSS-Fe Cbn-FA (PRENATAL VITAMIN) tablet, Take 1 tablet by mouth daily., Disp: , Rfl:   ibuprofen (ADVIL,MOTRIN) 600 MG tablet, Take 1 tablet (600 mg total) by mouth every 6 (six) hours as needed for Fever (pain)., Disp: 50 tablet, Rfl: 0;  oxyCODONE (ROXICODONE) 5 MG immediate release tablet, Take 1 tablet (5 mg total) by mouth every 6 (six) hours  as needed., Disp: 50 tablet, Rfl: 0  Family and Social history reviewed.   Medications reviewed         BP 120/77 mmHg  Pulse 98  Temp(Src) 98.1 F (36.7 C) (Oral)  Resp 16  Ht 1.626 m (5\' 4" )  Wt 72.122 kg (159 lb)  BMI 27.28 kg/m2  SpO2 98%  Breastfeeding? Yes   Filed Vitals:    09/14/14 1334   BP: 120/77   Pulse: 98   Temp: 98.1 F (36.7 C)   TempSrc: Oral   Resp: 16   Height: 1.626 m (5\' 4" )   Weight: 72.122 kg (159 lb)   SpO2: 98%       Nursing note reviewed. Vital signs reviewed.     Objective:    Physical Exam   Constitutional: She appears well-developed and well-nourished.   HENT:   Head: Normocephalic and atraumatic.   Cardiovascular: Normal rate and regular rhythm.    Pulmonary/Chest: Effort normal and breath sounds normal. No respiratory distress. She has no wheezes.   Abdominal: Soft. Bowel sounds are normal. She exhibits no distension. There is no tenderness.   Psychiatric: She has a normal mood and affect. Her behavior is normal.     Assessment and Plan:  Jannat was seen today for establish care and diabetes.    Diagnoses and all orders for this visit:    Type 1 diabetes mellitus without complication  Orders:  -     Ambulatory referral to Endocrinology  -     Comprehensive metabolic panel; Future  -     Lipid panel; Future  -     Hemoglobin A1C; Future  -     Microalbumin, Random Urine; Future    Screening for diabetic retinopathy  Orders:  -  Ambulatory referral to Ophthalmology    Skin exam, screening for cancer  Orders:  -     Ambulatory referral to Dermatology    Post partum depression  Orders:  -     Ambulatory referral to Psychiatry        t1dm--- apparently well controlled. Will obtain labs to ensure.   Refer to Endo .     As above. appropriate labs/studies/rx  ordered and/or referrals placed. Await results which dictate mgmt.             F/u prn. Discussed yearly physicals.     Procedures

## 2014-09-14 NOTE — Progress Notes (Signed)
1. Have you self referred yourself since we last saw you?    Refer to care team   Or  Add specialists:    NO

## 2014-09-16 ENCOUNTER — Encounter (INDEPENDENT_AMBULATORY_CARE_PROVIDER_SITE_OTHER): Payer: Self-pay | Admitting: Family Medicine

## 2014-09-16 DIAGNOSIS — F53 Postpartum depression: Secondary | ICD-10-CM | POA: Insufficient documentation

## 2014-09-16 DIAGNOSIS — E10649 Type 1 diabetes mellitus with hypoglycemia without coma: Secondary | ICD-10-CM | POA: Insufficient documentation

## 2014-09-18 ENCOUNTER — Encounter (INDEPENDENT_AMBULATORY_CARE_PROVIDER_SITE_OTHER): Payer: Self-pay | Admitting: Family Medicine

## 2014-09-18 DIAGNOSIS — J4520 Mild intermittent asthma, uncomplicated: Secondary | ICD-10-CM | POA: Insufficient documentation

## 2014-09-23 ENCOUNTER — Ambulatory Visit (INDEPENDENT_AMBULATORY_CARE_PROVIDER_SITE_OTHER): Admitting: Endocrinology, Diabetes and Metabolism

## 2014-09-23 ENCOUNTER — Encounter (INDEPENDENT_AMBULATORY_CARE_PROVIDER_SITE_OTHER): Payer: Self-pay | Admitting: Endocrinology, Diabetes and Metabolism

## 2014-09-23 ENCOUNTER — Other Ambulatory Visit

## 2014-09-23 VITALS — BP 127/83 | HR 83 | Ht 64.5 in | Wt 158.6 lb

## 2014-09-23 DIAGNOSIS — E109 Type 1 diabetes mellitus without complications: Secondary | ICD-10-CM

## 2014-09-23 DIAGNOSIS — Z1329 Encounter for screening for other suspected endocrine disorder: Secondary | ICD-10-CM

## 2014-09-23 MED ORDER — INSULIN SYRINGE-NEEDLE U-100 31G X 5/16" 0.5 ML MISC
Status: AC
Start: 2014-09-23 — End: ?

## 2014-09-23 MED ORDER — GLUCOSE BLOOD VI STRP
ORAL_STRIP | Status: DC
Start: 2014-09-23 — End: 2015-08-13

## 2014-09-23 MED ORDER — GLUCAGON (RDNA) 1 MG IJ KIT
1.00 mg | PACK | Freq: Once | INTRAMUSCULAR | Status: DC | PRN
Start: 2014-09-23 — End: 2015-08-13

## 2014-09-23 MED ORDER — INSULIN LISPRO 100 UNIT/ML SC SOLN
SUBCUTANEOUS | Status: DC
Start: 2014-09-23 — End: 2015-08-13

## 2014-09-23 MED ORDER — KETONE BLOOD TEST VI STRP
ORAL_STRIP | Status: AC
Start: 2014-09-23 — End: ?

## 2014-09-23 MED ORDER — INSULIN GLARGINE 100 UNIT/ML SC SOLN
24.00 [IU] | Freq: Every evening | SUBCUTANEOUS | Status: AC
Start: 2014-09-23 — End: 2015-09-24

## 2014-09-23 NOTE — Progress Notes (Signed)
Subjective:       Patient ID: Allison Salazar is a 28 y.o. female.    HPI  28 year old woman with exercise induced asthma, eczema, postpartum depresion and type 1 diabetes for 26 years.    Started using the pump in 2001 initially on minimed, now on animas for 8 years, using dexcom for 5 years. Last A1c is 4.9% to 5.0% . She is allergic to the medtronic adhesive.    Insulin pump settings:  Basal:  11 pm - 9 am - 1.0  9 am to 11pm - 1.2  I:C - 1:10  ISF: 1:40  Range - 80-120    Pt is 2 months postpartum and lactating and has had to cut down on her insulin by almost 60%, currently experiencing hypoglycemia occasionally.    SMBG:  Check FS 7-8 times daily, no glucose log available, unable to download animas pump.    Hypoglycemia:  A couple a week in the 60s, is able to catch it before it goes too low. Treats with apple juice 16 gm of carbohydrates and repeats if necessary, glucose sustains better if taken with protein. Orange juice given her lactating baby a diaper rash. Does not like glucose tablets.     DKA: Had pump malfunction in high school and was out of insulin for 4 hours, was in DKA upon arrival at the hospital.    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 Has a healthy retina, last saw ophthalmologist 7 months ago.    She had postpartum depression with her first child and after her current pregnancy, experiencing insomnia, having strange and vivid dreams, was on zoloft after her first pregnancy which helped which she was on for 1 year. She is not on any antidepressant at this time.    The following portions of the patient's history were reviewed and updated as appropriate: allergies, current medications, past family history, past medical history, past social history, past surgical history and problem list.    Review of Systems   Constitutional: Positive for fatigue.        She is taking care of a 2 months old    Eyes: Positive for visual disturbance.   Respiratory: Negative for cough and shortness of breath.     Cardiovascular: Negative for chest pain.   Gastrointestinal: Negative for diarrhea and constipation.   Endocrine: Negative for polydipsia, polyphagia and polyuria.   Musculoskeletal: Negative for myalgias and arthralgias.   Psychiatric/Behavioral: Positive for sleep disturbance and dysphoric mood.   All other systems reviewed and are negative.          Objective:    Physical Exam   Constitutional: She is oriented to person, place, and time. She appears well-developed and well-nourished.   Eyes: Conjunctivae and EOM are normal. No scleral icterus.   Neck: Neck supple. No thyromegaly present.   Cardiovascular: Normal rate, regular rhythm and normal heart sounds.    Pulmonary/Chest: Effort normal and breath sounds normal. No respiratory distress. She has no wheezes.   Lymphadenopathy:     She has no cervical adenopathy.   Neurological: She is alert and oriented to person, place, and time.   Skin: Skin is warm and dry. No erythema.   Normal foot exam and monofilament testing   Psychiatric: She has a normal mood and affect. Her behavior is normal.           Assessment:       28 year old woman with type 1 DM  diagnosed at age 28 on animas pump for 8 years and dexcom for 5 years, currently lactating a 29 month old old      Plan:       Type 1 DM:  - Last a1c 5%, no glucose log available, unable to download pump due to unavailability of software  - Pump setting reviewed.  - Pt is well versed with the management of diabetes and insulin pump and is able to make adjustments to her insulin to prevent and treat hypoglycemia. She was informed that lactation is a high energy activity and lower BS and lower insulin requirements are to be expected.  - Pt will continue to monitor FS 7-8 times daily and bring meter, at the next visit, we will be able to download animas pump at that time  - Pt encouraged to call me if continue to experience hypoglycemia.  - Check a1c, lipid panel, urine microalbumin, TSH, B12  - Normal foot exam  - uptodate  with ophthalmology exam      RTC in 3 months  Procedures

## 2014-09-24 ENCOUNTER — Telehealth (INDEPENDENT_AMBULATORY_CARE_PROVIDER_SITE_OTHER): Payer: Self-pay | Admitting: Endocrinology, Diabetes and Metabolism

## 2014-09-24 ENCOUNTER — Other Ambulatory Visit (INDEPENDENT_AMBULATORY_CARE_PROVIDER_SITE_OTHER): Payer: Self-pay | Admitting: Endocrinology, Diabetes and Metabolism

## 2014-09-24 DIAGNOSIS — D510 Vitamin B12 deficiency anemia due to intrinsic factor deficiency: Secondary | ICD-10-CM

## 2014-09-24 DIAGNOSIS — E538 Deficiency of other specified B group vitamins: Secondary | ICD-10-CM

## 2014-09-24 DIAGNOSIS — E109 Type 1 diabetes mellitus without complications: Secondary | ICD-10-CM

## 2014-09-24 LAB — MICROALBUMIN, RANDOM URINE
Creatinine, UR: 87 mg/dL (ref 20–320)
Microalbumin/Creatinine Ratio: 6.9 mcg/mg crea
Microalbumin: 0.6 mg/dL

## 2014-09-24 LAB — COMPREHENSIVE METABOLIC PANEL
ALT: 18 U/L (ref 6–29)
AST (SGOT): 20 U/L (ref 10–30)
Albumin/Globulin Ratio: 1.7 (ref 1.0–2.5)
Albumin: 4.7 G/DL (ref 3.6–5.1)
Alkaline Phosphatase: 73 U/L (ref 33–115)
BUN: 12 MG/DL (ref 7–25)
Bilirubin, Total: 0.4 MG/DL (ref 0.2–1.2)
CO2: 23 mmol/L (ref 19–30)
Calcium: 9.4 MG/DL (ref 8.6–10.2)
Chloride: 103 mmol/L (ref 98–110)
Creatinine: 0.73 mg/dL (ref 0.50–1.10)
EGFR African American: 129 mL/min/{1.73_m2} (ref >=60)
EGFR: 111 mL/min/{1.73_m2} (ref >=60)
Globulin: 2.8 G/DL (ref 1.9–3.7)
Glucose: 117 MG/DL — ABNORMAL HIGH (ref 65–99)
Potassium: 3.8 mmol/L (ref 3.5–5.3)
Protein, Total: 7.5 G/DL (ref 6.1–8.1)
Sodium: 139 mmol/L (ref 135–146)

## 2014-09-24 LAB — TSH: TSH: 1.14 mIU/L (ref 0.40–4.50)

## 2014-09-24 LAB — LIPID PANEL
Cholesterol / HDL Ratio: 1.8 (calc) (ref 0.0–5.0)
Cholesterol: 182 mg/dL (ref 125–200)
HDL: 100 mg/dL (ref >=46)
LDL Calculated: 75 mg/dL (ref <130)
Non HDL Cholesterol (LDL and VLDL): 82 mg/dL
Triglycerides: 33 mg/dL (ref <150)

## 2014-09-24 LAB — VITAMIN B12: Vitamin B-12: 170 pg/mL — ABNORMAL LOW (ref 200–1100)

## 2014-09-24 NOTE — Telephone Encounter (Signed)
Vitamin b12 is low likely pernicious anemia, will need b12 replacement.

## 2014-09-24 NOTE — Telephone Encounter (Signed)
Pt has Vitamin B12 def, will need replacement. Msg left.

## 2014-09-25 ENCOUNTER — Other Ambulatory Visit

## 2014-09-25 ENCOUNTER — Other Ambulatory Visit (FREE_STANDING_LABORATORY_FACILITY)

## 2014-09-25 ENCOUNTER — Other Ambulatory Visit (INDEPENDENT_AMBULATORY_CARE_PROVIDER_SITE_OTHER): Admitting: Endocrinology, Diabetes and Metabolism

## 2014-09-25 DIAGNOSIS — E109 Type 1 diabetes mellitus without complications: Secondary | ICD-10-CM

## 2014-09-25 DIAGNOSIS — Z029 Encounter for administrative examinations, unspecified: Secondary | ICD-10-CM

## 2014-09-25 DIAGNOSIS — E538 Deficiency of other specified B group vitamins: Secondary | ICD-10-CM

## 2014-09-25 LAB — HEMOLYSIS INDEX: Hemolysis Index: 15 (ref 0–18)

## 2014-09-25 LAB — LIPID PANEL
Cholesterol / HDL Ratio: 2.1
Cholesterol: 175 mg/dL (ref 0–199)
HDL: 83 mg/dL (ref >40)
LDL Calculated: 86 mg/dL (ref 0–99)
Triglycerides: 29 mg/dL — ABNORMAL LOW (ref 34–149)
VLDL Calculated: 6 mg/dL — ABNORMAL LOW (ref 10–40)

## 2014-09-25 MED ORDER — CYANOCOBALAMIN 1000 MCG/ML IJ SOLN
100.00 ug | Freq: Every day | INTRAMUSCULAR | Status: DC
Start: 2014-09-28 — End: 2014-09-29
  Administered 2014-09-28 – 2014-09-29 (×2): 100 ug via INTRAMUSCULAR

## 2014-09-25 NOTE — Addendum Note (Signed)
Addended by: Estil Daft. on: 09/25/2014 09:46 AM     Modules accepted: Orders

## 2014-09-28 ENCOUNTER — Ambulatory Visit (INDEPENDENT_AMBULATORY_CARE_PROVIDER_SITE_OTHER)

## 2014-09-28 DIAGNOSIS — E538 Deficiency of other specified B group vitamins: Secondary | ICD-10-CM

## 2014-09-28 DIAGNOSIS — G320 Subacute combined degeneration of spinal cord in diseases classified elsewhere: Secondary | ICD-10-CM

## 2014-09-29 ENCOUNTER — Encounter (INDEPENDENT_AMBULATORY_CARE_PROVIDER_SITE_OTHER): Payer: Self-pay

## 2014-09-29 ENCOUNTER — Ambulatory Visit (INDEPENDENT_AMBULATORY_CARE_PROVIDER_SITE_OTHER)

## 2014-09-29 ENCOUNTER — Other Ambulatory Visit (INDEPENDENT_AMBULATORY_CARE_PROVIDER_SITE_OTHER): Payer: Self-pay | Admitting: Endocrinology, Diabetes and Metabolism

## 2014-09-29 DIAGNOSIS — G320 Subacute combined degeneration of spinal cord in diseases classified elsewhere: Secondary | ICD-10-CM

## 2014-09-29 DIAGNOSIS — E538 Deficiency of other specified B group vitamins: Secondary | ICD-10-CM

## 2014-09-29 LAB — INTRINSIC FACTOR BLOCKING ANTIBODY: Intrinsic Factor Block Ab: POSITIVE — AB

## 2014-09-29 LAB — FOLATE: Folate: >24 ng/mL (ref >5.4)

## 2014-09-30 ENCOUNTER — Telehealth (INDEPENDENT_AMBULATORY_CARE_PROVIDER_SITE_OTHER): Payer: Self-pay | Admitting: Endocrinology, Diabetes and Metabolism

## 2014-09-30 ENCOUNTER — Encounter (INDEPENDENT_AMBULATORY_CARE_PROVIDER_SITE_OTHER): Payer: Self-pay | Admitting: Family Medicine

## 2014-09-30 ENCOUNTER — Ambulatory Visit (INDEPENDENT_AMBULATORY_CARE_PROVIDER_SITE_OTHER)

## 2014-09-30 DIAGNOSIS — E109 Type 1 diabetes mellitus without complications: Secondary | ICD-10-CM

## 2014-09-30 DIAGNOSIS — E538 Deficiency of other specified B group vitamins: Secondary | ICD-10-CM | POA: Insufficient documentation

## 2014-09-30 DIAGNOSIS — D510 Vitamin B12 deficiency anemia due to intrinsic factor deficiency: Secondary | ICD-10-CM | POA: Insufficient documentation

## 2014-09-30 NOTE — Telephone Encounter (Addendum)
Noted.     Per Dr Donette Larry: Pt has pernicious anemia, is currently on vitamin B12 replacement, she got first 2 injections in clinic, her husband is a Careers adviser so they wish to continue the remainder injections at home.

## 2014-09-30 NOTE — Telephone Encounter (Signed)
Lab results, questions regarding a1c, b12 stilldo since in normal range.    [phone] patient can be reached at

## 2014-09-30 NOTE — Telephone Encounter (Signed)
Returned call. a1c is not done. Will check on it for pt. b12 is low so will need injections. Pt acknowledges.

## 2014-09-30 NOTE — Telephone Encounter (Signed)
a1c was not drawn for unclear reasons. Will reorder, pt will pick up slip from front desk next week.

## 2014-10-01 ENCOUNTER — Other Ambulatory Visit (INDEPENDENT_AMBULATORY_CARE_PROVIDER_SITE_OTHER): Payer: Self-pay | Admitting: Family Medicine

## 2014-10-01 ENCOUNTER — Ambulatory Visit (INDEPENDENT_AMBULATORY_CARE_PROVIDER_SITE_OTHER)

## 2014-10-01 DIAGNOSIS — F53 Postpartum depression: Secondary | ICD-10-CM

## 2014-10-01 MED ORDER — SERTRALINE HCL 50 MG PO TABS
50.00 mg | ORAL_TABLET | Freq: Every day | ORAL | Status: DC
Start: 2014-10-01 — End: 2015-08-18

## 2014-10-02 ENCOUNTER — Ambulatory Visit (INDEPENDENT_AMBULATORY_CARE_PROVIDER_SITE_OTHER)

## 2014-10-02 NOTE — Progress Notes (Signed)
Patient presented to the office for B-12  administration.    Vaccine was given to RIGHT deltoid.    No reaction was noted and patient left in good condition.

## 2014-10-02 NOTE — Progress Notes (Signed)
Patient presented to the office for B-12  administration.    Vaccine was given to RIGHT deltoid.    No reaction was noted and patient left in good condition.

## 2014-10-05 ENCOUNTER — Ambulatory Visit (INDEPENDENT_AMBULATORY_CARE_PROVIDER_SITE_OTHER)

## 2014-10-06 ENCOUNTER — Ambulatory Visit (INDEPENDENT_AMBULATORY_CARE_PROVIDER_SITE_OTHER)

## 2014-10-22 ENCOUNTER — Encounter (INDEPENDENT_AMBULATORY_CARE_PROVIDER_SITE_OTHER): Payer: Self-pay | Admitting: Endocrinology, Diabetes and Metabolism

## 2014-10-26 ENCOUNTER — Encounter (INDEPENDENT_AMBULATORY_CARE_PROVIDER_SITE_OTHER): Payer: Self-pay | Admitting: Endocrinology, Diabetes and Metabolism

## 2014-12-25 ENCOUNTER — Ambulatory Visit (INDEPENDENT_AMBULATORY_CARE_PROVIDER_SITE_OTHER): Admitting: Endocrinology, Diabetes and Metabolism

## 2014-12-31 ENCOUNTER — Ambulatory Visit (INDEPENDENT_AMBULATORY_CARE_PROVIDER_SITE_OTHER): Admitting: Endocrinology, Diabetes and Metabolism

## 2015-01-06 ENCOUNTER — Ambulatory Visit (INDEPENDENT_AMBULATORY_CARE_PROVIDER_SITE_OTHER): Admitting: Endocrinology, Diabetes and Metabolism

## 2015-01-06 ENCOUNTER — Encounter (INDEPENDENT_AMBULATORY_CARE_PROVIDER_SITE_OTHER): Payer: Self-pay | Admitting: Endocrinology, Diabetes and Metabolism

## 2015-01-06 VITALS — BP 111/69 | HR 92 | Ht 64.0 in | Wt 143.2 lb

## 2015-01-06 DIAGNOSIS — E1065 Type 1 diabetes mellitus with hyperglycemia: Secondary | ICD-10-CM

## 2015-01-06 DIAGNOSIS — D510 Vitamin B12 deficiency anemia due to intrinsic factor deficiency: Secondary | ICD-10-CM

## 2015-01-06 DIAGNOSIS — IMO0002 Reserved for concepts with insufficient information to code with codable children: Secondary | ICD-10-CM

## 2015-01-06 NOTE — Progress Notes (Signed)
Subjective:       Patient ID: Allison Salazar is a 29 y.o. female.    HPI  29 year old woman with exercise induced asthma, eczema, postpartum depresion and type 1 diabetes for 26 years.    Insulin pump settings:  Basal:  11 pm - 9 am - 1.0  9 am to 11pm - 1.2  I:C - 1:10  ISF: 1:40  Range - 80-120    Has been going through multiple mild sicknesses between her and the kids, as her 2nd child just started going to preschool. Over the last 3 months, has been having issues with hypoglycemia when she has vomiting and diarrhea, and high sugars once she starts eating, she has been contemplating if she should use a lower I:C ratio. She is on her regular basal rate today, sugar is 187 in the office. She has been having issues with her Dexcom malfunctioning and is trying to get that replaced, but experiencing some issues with her insurance company. As she has some degree of hypoglycemia unawareness, she relies heavily on her CGM.  On a sick day, she decreases her basal rate by 50%.     The following portions of the patient's history were reviewed and updated as appropriate: allergies, current medications, past family history, past medical history, past social history, past surgical history and problem list.    Review of Systems   Constitutional: Positive for fatigue.   Eyes: Negative for visual disturbance.   Respiratory: Negative for shortness of breath.    Psychiatric/Behavioral: Negative for sleep disturbance and dysphoric mood.   All other systems reviewed and are negative.        Objective:    Physical Exam   Constitutional: She appears well-developed.   HENT:   Head: Normocephalic.   Eyes: Conjunctivae are normal. No scleral icterus.   Neck: No thyromegaly present.   Cardiovascular: Normal rate and regular rhythm.    Pulmonary/Chest: Effort normal and breath sounds normal.   Lymphadenopathy:     She has no cervical adenopathy.   Skin:   Normal foot exam   Psychiatric: She has a normal mood and affect.   Vitals reviewed.         Assessment:       29 year old pleasant woman with type 1 DM on Animas pump and Dexcom CGM, currently experiencing fluctutaion in sugars due to multiple episodes of URI and viral GI illnesses. Dexcom is malfunctioning for almost 1 month.      Plan:       T1DM:  Decrease basal rate by 50% when sick, continue other settings. Discussed that goal is to avoid hypoglycemia, therefore not to use a lower I:C ratio while sick. Once the acute sickness is over, will have better idea about insulin requirements.  Pt to call me if continue to have issues with hypoglycemia.  Postpartum depression under control on zoloft.  Counseled to check FS 3-4 times daily.  Pt aware of management of hypoglycemic episode.  Encouraged pt to see ophthalmologist.  No neuropathy.    Pernicious anemia: Now on B12 injections once a month.  Recheck TSH in 09/2015. Normal TSH 09/2014.    Labs: a1c, cmp, B12    Procedures

## 2015-04-09 ENCOUNTER — Ambulatory Visit (INDEPENDENT_AMBULATORY_CARE_PROVIDER_SITE_OTHER): Admitting: Endocrinology, Diabetes and Metabolism

## 2015-04-15 ENCOUNTER — Ambulatory Visit (INDEPENDENT_AMBULATORY_CARE_PROVIDER_SITE_OTHER): Admitting: Endocrinology, Diabetes and Metabolism

## 2015-04-27 ENCOUNTER — Telehealth (INDEPENDENT_AMBULATORY_CARE_PROVIDER_SITE_OTHER): Payer: Self-pay | Admitting: Endocrinology, Diabetes and Metabolism

## 2015-04-27 NOTE — Telephone Encounter (Signed)
Do you want labs before 06/24/2015 appointment if so mail order to patient     [phone] patient can be reached at

## 2015-04-28 ENCOUNTER — Encounter (INDEPENDENT_AMBULATORY_CARE_PROVIDER_SITE_OTHER): Payer: Self-pay | Admitting: Endocrinology, Diabetes and Metabolism

## 2015-04-28 ENCOUNTER — Other Ambulatory Visit (INDEPENDENT_AMBULATORY_CARE_PROVIDER_SITE_OTHER): Payer: Self-pay | Admitting: Endocrinology, Diabetes and Metabolism

## 2015-04-28 DIAGNOSIS — IMO0002 Reserved for concepts with insufficient information to code with codable children: Secondary | ICD-10-CM

## 2015-06-14 ENCOUNTER — Ambulatory Visit (INDEPENDENT_AMBULATORY_CARE_PROVIDER_SITE_OTHER): Admitting: Endocrinology, Diabetes and Metabolism

## 2015-06-24 ENCOUNTER — Ambulatory Visit (INDEPENDENT_AMBULATORY_CARE_PROVIDER_SITE_OTHER): Admitting: Endocrinology, Diabetes and Metabolism

## 2015-08-11 ENCOUNTER — Other Ambulatory Visit (FREE_STANDING_LABORATORY_FACILITY)

## 2015-08-11 ENCOUNTER — Ambulatory Visit (INDEPENDENT_AMBULATORY_CARE_PROVIDER_SITE_OTHER): Admitting: Family Medicine

## 2015-08-11 ENCOUNTER — Encounter (INDEPENDENT_AMBULATORY_CARE_PROVIDER_SITE_OTHER): Payer: Self-pay | Admitting: Family Medicine

## 2015-08-11 VITALS — BP 104/71 | HR 75 | Temp 98.3°F | Resp 12 | Ht 64.0 in | Wt 153.0 lb

## 2015-08-11 DIAGNOSIS — E1065 Type 1 diabetes mellitus with hyperglycemia: Secondary | ICD-10-CM

## 2015-08-11 DIAGNOSIS — IMO0002 Reserved for concepts with insufficient information to code with codable children: Secondary | ICD-10-CM

## 2015-08-11 DIAGNOSIS — M79622 Pain in left upper arm: Secondary | ICD-10-CM

## 2015-08-11 DIAGNOSIS — J4520 Mild intermittent asthma, uncomplicated: Secondary | ICD-10-CM

## 2015-08-11 LAB — COMPREHENSIVE METABOLIC PANEL
ALT: 13 U/L (ref 0–55)
AST (SGOT): 17 U/L (ref 5–34)
Albumin/Globulin Ratio: 1.4 (ref 0.9–2.2)
Albumin: 4 g/dL (ref 3.5–5.0)
Alkaline Phosphatase: 94 U/L (ref 37–106)
BUN: 13 mg/dL (ref 7.0–19.0)
Bilirubin, Total: 0.7 mg/dL (ref 0.1–1.2)
CO2: 24 mEq/L (ref 21–30)
Calcium: 9 mg/dL (ref 8.5–10.5)
Chloride: 104 mEq/L (ref 100–111)
Creatinine: 0.8 mg/dL (ref 0.4–1.5)
Globulin: 2.8 g/dL (ref 2.0–3.7)
Glucose: 198 mg/dL — ABNORMAL HIGH (ref 70–100)
Potassium: 3.9 mEq/L (ref 3.5–5.3)
Protein, Total: 6.8 g/dL (ref 6.0–8.3)
Sodium: 139 mEq/L (ref 135–146)

## 2015-08-11 LAB — LIPID PANEL
Cholesterol / HDL Ratio: 2.3
Cholesterol: 224 mg/dL — ABNORMAL HIGH (ref 0–199)
HDL: 97 mg/dL (ref 40–9999)
LDL Calculated: 116 mg/dL — ABNORMAL HIGH (ref 0–99)
Triglycerides: 54 mg/dL (ref 34–149)
VLDL Calculated: 11 mg/dL (ref 10–40)

## 2015-08-11 LAB — GFR: EGFR: >60

## 2015-08-11 LAB — HEMOLYSIS INDEX: Hemolysis Index: 8 (ref 0–18)

## 2015-08-11 MED ORDER — BENZONATATE 100 MG PO CAPS
100.0000 mg | ORAL_CAPSULE | Freq: Three times a day (TID) | ORAL | Status: DC | PRN
Start: 2015-08-11 — End: 2015-09-30

## 2015-08-11 MED ORDER — ALBUTEROL SULFATE (2.5 MG/3ML) 0.083% IN NEBU
2.5000 mg | INHALATION_SOLUTION | RESPIRATORY_TRACT | Status: DC | PRN
Start: 2015-08-11 — End: 2016-07-19

## 2015-08-11 MED ORDER — NEBULIZER MISC
Status: DC
Start: 2015-08-11 — End: 2016-07-19

## 2015-08-11 MED ORDER — ALBUTEROL SULFATE (2.5 MG/3ML) 0.083% IN NEBU
2.5000 mg | INHALATION_SOLUTION | Freq: Once | RESPIRATORY_TRACT | Status: DC
Start: 2015-08-11 — End: 2016-12-28

## 2015-08-11 MED ORDER — PROMETHAZINE-CODEINE 6.25-10 MG/5ML PO SYRP
5.0000 mL | ORAL_SOLUTION | ORAL | Status: DC | PRN
Start: 2015-08-11 — End: 2015-08-13

## 2015-08-11 MED ORDER — METHYLPREDNISOLONE 4 MG PO TBPK
4.0000 mg | ORAL_TABLET | Freq: Every day | ORAL | Status: DC
Start: 2015-08-11 — End: 2015-09-30

## 2015-08-11 NOTE — Progress Notes (Signed)
Subjective:       Patient ID: Allison Salazar is a 29 y.o. female.    HPI   Chief Complaint   Patient presents with   . Cough     cough for over a month, asthma is flaring up, cough is bad at night, was at [facility] on 08/09/15 and given inhalers,  pain under left arm   As above , seen for cough, productive , constant x few weeks  Has been taking OTCs, no significant improvement. Now worse.     given advair. Still feels tight.   Feels clicking sensation in chest  and coughing at night.   Alb helps sometimes, but sometimes makes it worse.   Did not receive steroids at urgent care    Axilla pain--   Left axilla--   Stopped nursing 1 month ago.   Now notes pain under armpit with lifting things overhead. No overt injury but does have 3 kids that she lifts often.   No breast nodules, lesions, nor nipple drainage.      No f/s/c.         The following portions of the patient's history were reviewed and updated as appropriate: allergies, current medications, past family history, past medical history, past social history, past surgical history and problem list.    Review of Systems  ROS: as in HPI       Patient Active Problem List   Diagnosis   . [redacted] weeks gestation of pregnancy   . Type 1 diabetes mellitus without complication   . Post partum depression   . Mild intermittent asthma without complication   . B12 deficiency   . Pernicious anemia     Allergies   Allergen Reactions   . Latex Dermatitis     Problem list and Allergies reviewed.     Past Medical History   Diagnosis Date   . Asthma, exercise induced 2015     NO inhaler use 70yrs   . History of blood transfusion 2012     long labor, atony   . Headache(784.0) 2015     occ migraine   . PONV (postoperative nausea and vomiting)    . Postpartum depression 2012     zoloft   . Impaired vision      contacts   . Eczema 2015   . Diabetes mellitus 1989     insulin pump   . Pregnant state, incidental    . Anemia    . Seasonal allergies      Past Surgical History   Procedure  Laterality Date   . Appendectomy  2005     scope   . Wisdom tooth extraction  2004,    . Cesarean section  2012 , 2014, 2015     FTP   . Cesarean section N/A 06/30/2014     Procedure: CESAREAN SECTION;  Surgeon: Loistine Chance, MD;  Location: [facility] LABOR OR;  Service: Obstetrics;  Laterality: N/A;   . Molers  2007     Family History   Problem Relation Age of Onset   . Heart attack Father 14   . Heart disease Father    . Hypertension Father    . Arthritis Father    . Hypertension Paternal Grandmother    . Hyperlipidemia Paternal Grandmother    . Kidney disease Paternal Grandmother    . Asthma Neg Hx    . Diabetes Neg Hx    . Stomach cancer Neg Hx    .  Stroke Neg Hx    . Cancer Mother      CERVICAL   . Hypertension Paternal Grandfather    . Kidney disease Paternal Grandfather    . Heart attack Paternal Grandfather    . Heart disease Paternal Grandfather    . Cervical cancer Sister    . Cancer Sister      CERVICAL AND SKIN   . Kidney disease Paternal Uncle      KIDNEY TRANSPLANT   . Osteoporosis Maternal Grandmother      Social History     Social History   . Marital Status: Married     Spouse Name: N/A   . Number of Children: N/A   . Years of Education: N/A     Occupational History   . Not on file.     Social History Main Topics   . Smoking status: Never Smoker    . Smokeless tobacco: Not on file   . Alcohol Use: No   . Drug Use: No   . Sexual Activity:     Partners: Male     Birth Control/ Protection: Condom     Other Topics Concern   . 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        Current outpatient prescriptions:   .  albuterol (PROVENTIL HFA;VENTOLIN HFA) 108 (90 BASE) MCG/ACT inhaler, Inhale 2 puffs into the lungs every 6 (six) hours as needed for Wheezing., Disp: , Rfl:   .  fluticasone-salmeterol (ADVAIR DISKUS) 250-50 MCG/DOSE Aerosol Powder, Breath Activtivatede, Inhale 1 puff into the lungs 2 (two) times daily., Disp: , Rfl:   .  glucagon 1 MG  injection, Inject 1 mg into the vein once as needed., Disp: 1 each, Rfl: 0  .  glucose blood test strip, One touch ultra test strips. Use 1 strip 8 times a day, Disp: 250 each, Rfl: 11  .  insulin glargine (LANTUS) 100 UNIT/ML injection, Inject 24 Units into the skin nightly., Disp: 7.2 mL, Rfl: 11  .  insulin lispro (HUMALOG) 100 UNIT/ML injection, Use 80 units a day for Insulin pump, Disp: 10 mL, Rfl: 11  .  Insulin Syringe-Needle U-100 (BD INSULIN SYRINGE ULTRAFINE) 31G X 5/16" 0.5 ML Misc, Use 1 to inject insulin as needed, Disp: 50 each, Rfl: 0  .  Ketone Blood Test Strip, Use 1 as needed to check for urine ketones, Disp: 10 each, Rfl: 0  .  levonorgestrel (MIRENA) 20 MCG/24HR IUD, 1 each by Intrauterine route once., Disp: , Rfl:   .  Prenatal Vit-DSS-Fe Cbn-FA (PRENATAL VITAMIN) tablet, Take 1 tablet by mouth daily., Disp: , Rfl:   .  sertraline (ZOLOFT) 50 MG tablet, Take 1 tablet (50 mg total) by mouth daily., Disp: 30 tablet, Rfl: 8  .  albuterol (PROVENTIL) (2.5 MG/3ML) 0.083% nebulizer solution, Take 3 mLs (2.5 mg total) by nebulization every 4 (four) hours as needed for Wheezing or Shortness of Breath., Disp: 25 each, Rfl: 5  .  benzonatate (TESSALON PERLES) 100 MG capsule, Take 1 capsule (100 mg total) by mouth 3 (three) times daily as needed for Cough., Disp: 30 capsule, Rfl: 1  .  methylPREDNISolone (MEDROL DOSPACK) 4 MG tablet, Take 1 tablet (4 mg total) by mouth daily. follow package directions, Disp: 21 tablet, Rfl: 0  .  Nebulizer Misc, 1 nebulizer machine kit with tubing. Dx: asthma, Disp: 1 each, Rfl: 0  .  promethazine-codeine (PHENERGAN WITH CODEINE) 6.25-10 MG/5ML syrup, Take 5 mLs by mouth every 4 (four) hours as needed for Cough., Disp: 118 mL, Rfl: 0    Current facility-administered medications:   .  albuterol (PROVENTIL) nebulizer solution 2.5 mg, 2.5 mg, Nebulization, Once, Delila Kuklinski, Cathlyn Parsons, MD  Family and Social history reviewed.   Medications reviewed         BP 104/71 mmHg   Pulse 75  Temp(Src) 98.3 F (36.8 C) (Oral)  Resp 12  Ht 1.626 m (5\' 4" )  Wt 69.4 kg (153 lb)  BMI 26.25 kg/m2   Filed Vitals:    08/11/15 0802   BP: 104/71   Pulse: 75   Temp: 98.3 F (36.8 C)   TempSrc: Oral   Resp: 12   Height: 1.626 m (5\' 4" )   Weight: 69.4 kg (153 lb)       Nursing note reviewed. Vital signs reviewed.         Objective:    Physical Exam   Constitutional: She is oriented to person, place, and time. She appears well-developed and well-nourished. No distress.   Occasional coughing, ill appearing, but non toxic   HENT:   Head: Normocephalic and atraumatic.   Right Ear: Hearing, tympanic membrane, external ear and ear canal normal.   Left Ear: Hearing, tympanic membrane, external ear and ear canal normal.   Nose: Nose normal.   Mouth/Throat: Uvula is midline and mucous membranes are normal. Posterior oropharyngeal erythema present. No oropharyngeal exudate or posterior oropharyngeal edema.   Eyes: Conjunctivae and EOM are normal. Pupils are equal, round, and reactive to light.   Cardiovascular: Normal rate and regular rhythm.    Pulmonary/Chest: Effort normal. No respiratory distress. She has wheezes. She has no rales.   Musculoskeletal:        Arms:  Circled area(s): location of pain   of l axilla at pectoralis lig   Lymphadenopathy:     She has no cervical adenopathy.     She has no axillary adenopathy.        Right axillary: No pectoral and no lateral adenopathy present.        Left axillary: No pectoral and no lateral adenopathy present.  Neurological: She is alert and oriented to person, place, and time.   Skin: Skin is warm and dry. She is not diaphoretic.         Assessment and Plan:  Allison Salazar was seen today for cough.    Diagnoses and all orders for this visit:    Mild intermittent asthma without complication  -     Nebulizer Misc; 1 nebulizer machine kit with tubing. Dx: asthma  -     albuterol (PROVENTIL) nebulizer solution 2.5 mg; Take 3 mLs (2.5 mg total) by nebulization one  time.    -     albuterol (PROVENTIL) (2.5 MG/3ML) 0.083% nebulizer solution; Take 3 mLs (2.5 mg total) by nebulization every 4 (four) hours as needed for Wheezing or Shortness of Breath.  -     methylPREDNISolone (MEDROL DOSPACK) 4 MG tablet; Take 1 tablet (4 mg total) by mouth daily. follow package directions  -     X-ray chest PA and lateral  -     promethazine-codeine (PHENERGAN WITH CODEINE) 6.25-10 MG/5ML syrup; Take 5 mLs by mouth every 4 (four) hours as needed for Cough.  -     benzonatate (TESSALON PERLES) 100 MG capsule; Take 1 capsule (100 mg total) by mouth 3 (three) times daily as needed  for Cough.    Pain in axilla, left         Treat with  tussionex/tessalon perles/ phenergan w/ codeine. Can add flonase for pnd.   Risk & Benefits of the new medication(s) were explained to the patient (and family) who verbalized understanding & agreed to the treatment plan. Patient (family) encouraged to contact me/clinical staff with any questions/concerns  Given handout on acute bronchitis.   Rtc/go to [facility] if symptoms worsen.     Alb neb given. With improvement.       l axilla pain-- suspect ligamentous injury. Advised otc nsaids.   Rtc/go to [facility] if symptoms worsen.               Return in about 1 week (around 08/18/2015).         Procedures

## 2015-08-11 NOTE — Progress Notes (Signed)
1. Have you self referred yourself since we last saw you? YES - [facility] - 08/09/15    Refer to care team   Or  Add specialists:

## 2015-08-11 NOTE — Addendum Note (Signed)
Addended by: Greta Doom on: 08/11/2015 01:13 PM     Modules accepted: Level of Service

## 2015-08-12 LAB — HEMOGLOBIN A1C
Average Estimated Glucose: 151.3 mg/dL
Hemoglobin A1C: 6.9 % — ABNORMAL HIGH (ref 4.6–5.9)

## 2015-08-13 ENCOUNTER — Encounter (INDEPENDENT_AMBULATORY_CARE_PROVIDER_SITE_OTHER): Payer: Self-pay | Admitting: Endocrinology, Diabetes and Metabolism

## 2015-08-13 ENCOUNTER — Ambulatory Visit (INDEPENDENT_AMBULATORY_CARE_PROVIDER_SITE_OTHER): Admitting: Endocrinology, Diabetes and Metabolism

## 2015-08-13 VITALS — BP 131/78 | HR 75 | Ht 64.0 in | Wt 155.0 lb

## 2015-08-13 DIAGNOSIS — C4491 Basal cell carcinoma of skin, unspecified: Secondary | ICD-10-CM

## 2015-08-13 DIAGNOSIS — E109 Type 1 diabetes mellitus without complications: Secondary | ICD-10-CM

## 2015-08-13 DIAGNOSIS — D510 Vitamin B12 deficiency anemia due to intrinsic factor deficiency: Secondary | ICD-10-CM

## 2015-08-13 MED ORDER — INSULIN LISPRO 100 UNIT/ML SC SOLN
SUBCUTANEOUS | Status: DC
Start: 2015-08-13 — End: 2015-09-20

## 2015-08-13 MED ORDER — GLUCOSE BLOOD VI STRP
ORAL_STRIP | Status: AC
Start: 2015-08-13 — End: ?

## 2015-08-13 MED ORDER — GLUCAGON (RDNA) 1 MG IJ KIT
PACK | INTRAMUSCULAR | Status: DC
Start: 2015-08-13 — End: 2016-12-19

## 2015-08-13 NOTE — Progress Notes (Signed)
Subjective:       Patient ID: Allison Salazar is a 29 y.o. female.    HPI  29 yr old woman with type 1 DM on animas pump seen for followup.  She has been experiencing high sugars for the last 2 days since she started prednisone course for asthma flare. Sugars have been over 500 since starting prednisone. As she is tapering dose down, is experiencing hypoglycemia. She increased basal to 2 u/hr, I:C to 5, ISF to 25. 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  Uses 50% temp basal when active.     Complications:  Last ophthal over a year ago. No retinopathy.  Sees dermatology for basal cell Ca.  No sx of neuropathy.  -ve microalbuminuria in 09/2014    Pernicious anemia:  Got B12 injections. Now on no replacement.    The following portions of the patient's history were reviewed and updated as appropriate: allergies, current medications, past family history, past medical history, past social history, past surgical history and problem list.    Review of Systems   Constitutional: Positive for fatigue. Negative for unexpected weight change.   Eyes: Negative for visual disturbance.   Respiratory: Positive for cough and shortness of breath.    Endocrine: Negative for polydipsia, polyphagia and polyuria.   All other systems reviewed and are negative.    BP 131/78 mmHg  Pulse 75  Ht 1.626 m (5\' 4" )  Wt 70.308 kg (155 lb)  BMI 26.59 kg/m2        Objective:    Physical Exam   Constitutional: She is oriented to person, place, and time. She appears well-developed.   HENT:   Head: Normocephalic and atraumatic.   Eyes: Conjunctivae are normal. No scleral icterus.   Neck: Neck supple. No thyromegaly present.   Cardiovascular: Normal rate, regular rhythm and normal heart sounds.    Pulmonary/Chest: Effort normal and breath sounds normal. No respiratory distress. She has no wheezes.   Neurological: She is alert and oriented to person, place, and time.   Skin:   No ulcers or calluses in b/l feet    Nursing note and vitals reviewed.          Assessment:       1. Type 1 diabetes mellitus with worsening control  2. Pernicious anemia -stable  3. H/o basal cell ca      Plan:      Procedures    1. Decrease basal to 1.8u/hr. Decrease basal as prednisone tapers and return to previous settings.  Complications:  Refer to ophthalmology.  Normal foot sensory 09/2014    2. Take oral B12 1000 mcg daily. Check B12 levels with next set of labs.    3. Refer to dermatology.

## 2015-08-18 ENCOUNTER — Encounter (INDEPENDENT_AMBULATORY_CARE_PROVIDER_SITE_OTHER): Payer: Self-pay | Admitting: Family Medicine

## 2015-08-18 ENCOUNTER — Ambulatory Visit (INDEPENDENT_AMBULATORY_CARE_PROVIDER_SITE_OTHER): Admitting: Family Medicine

## 2015-08-18 VITALS — BP 113/75 | HR 76 | Temp 98.1°F | Ht 64.0 in | Wt 153.0 lb

## 2015-08-18 DIAGNOSIS — J4520 Mild intermittent asthma, uncomplicated: Secondary | ICD-10-CM

## 2015-08-18 DIAGNOSIS — F53 Postpartum depression: Secondary | ICD-10-CM

## 2015-08-18 MED ORDER — SERTRALINE HCL 50 MG PO TABS
50.0000 mg | ORAL_TABLET | Freq: Every day | ORAL | Status: DC
Start: 2015-08-18 — End: 2015-08-18

## 2015-08-18 MED ORDER — SERTRALINE HCL 50 MG PO TABS
50.0000 mg | ORAL_TABLET | Freq: Every day | ORAL | Status: DC
Start: 2015-08-18 — End: 2015-12-13

## 2015-08-18 MED ORDER — FLUTICASONE-SALMETEROL 250-50 MCG/DOSE IN AEPB
1.0000 | INHALATION_SPRAY | Freq: Two times a day (BID) | RESPIRATORY_TRACT | Status: DC
Start: 2015-08-18 — End: 2016-07-19

## 2015-08-18 NOTE — Progress Notes (Signed)
Subjective:       Patient ID: Allison Salazar is a 29 y.o. female.    HPI  Chief Complaint   Patient presents with   . Mild intermittent asthma without complication     F/U ; feeling much better   . Medication Refill   here for f/u from [facility] re: asthma attack.   Now resolved s/p rx.     Depression- improved with zoloft.    Still breast feeding.   Would like to keep it on 50mg  , no panic attacks, no anxiety.   Sleeping ok.     Wt Readings from Last 3 Encounters:   08/18/15 69.4 kg (153 lb)   08/13/15 70.308 kg (155 lb)   08/11/15 69.4 kg (153 lb)         The following portions of the patient's history were reviewed and updated as appropriate: allergies, current medications, past family history, past medical history, past social history, past surgical history and problem list.    Review of Systems   ROS: as in HPI       Patient Active Problem List   Diagnosis   . Type 1 diabetes mellitus without complication   . Post partum depression   . Mild intermittent asthma without complication   . B12 deficiency   . Pernicious anemia     Allergies   Allergen Reactions   . Latex Dermatitis     Problem list and Allergies reviewed.     Past Medical History   Diagnosis Date   . Asthma, exercise induced 2015     NO inhaler use 52yrs   . History of blood transfusion 2012     long labor, atony   . Headache(784.0) 2015     occ migraine   . PONV (postoperative nausea and vomiting)    . Postpartum depression 2012     zoloft   . Impaired vision      contacts   . Eczema 2015   . Diabetes mellitus 1989     insulin pump   . Pregnant state, incidental    . Anemia    . Seasonal allergies      Past Surgical History   Procedure Laterality Date   . Appendectomy  2005     scope   . Wisdom tooth extraction  2004,    . Cesarean section  2012 , 2014, 2015     FTP   . Cesarean section N/A 06/30/2014     Procedure: CESAREAN SECTION;  Surgeon: Loistine Chance, MD;  Location: [facility] LABOR OR;  Service: Obstetrics;  Laterality: N/A;   . Molers  2007      Family History   Problem Relation Age of Onset   . Heart attack Father 92   . Heart disease Father    . Hypertension Father    . Arthritis Father    . Hypertension Paternal Grandmother    . Hyperlipidemia Paternal Grandmother    . Kidney disease Paternal Grandmother    . Asthma Neg Hx    . Diabetes Neg Hx    . Stomach cancer Neg Hx    . Stroke Neg Hx    . Cancer Mother      CERVICAL   . Hypertension Paternal Grandfather    . Kidney disease Paternal Grandfather    . Heart attack Paternal Grandfather    . Heart disease Paternal Grandfather    . Cervical cancer Sister    . Cancer Sister  CERVICAL AND SKIN   . Kidney disease Paternal Uncle      KIDNEY TRANSPLANT   . Osteoporosis Maternal Grandmother      Social History     Social History   . Marital Status: Married     Spouse Name: N/A   . Number of Children: N/A   . Years of Education: N/A     Occupational History   . Not on file.     Social History Main Topics   . Smoking status: Never Smoker    . Smokeless tobacco: Not on file   . Alcohol Use: No   . Drug Use: No   . Sexual Activity:     Partners: Male     Birth Control/ Protection: Condom     Other Topics Concern   . 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        Current outpatient prescriptions:   .  albuterol (PROVENTIL HFA;VENTOLIN HFA) 108 (90 BASE) MCG/ACT inhaler, Inhale 2 puffs into the lungs every 6 (six) hours as needed for Wheezing., Disp: , Rfl:   .  albuterol (PROVENTIL) (2.5 MG/3ML) 0.083% nebulizer solution, Take 3 mLs (2.5 mg total) by nebulization every 4 (four) hours as needed for Wheezing or Shortness of Breath., Disp: 25 each, Rfl: 5  .  benzonatate (TESSALON PERLES) 100 MG capsule, Take 1 capsule (100 mg total) by mouth 3 (three) times daily as needed for Cough., Disp: 30 capsule, Rfl: 1  .  fluticasone-salmeterol (ADVAIR DISKUS) 250-50 MCG/DOSE Aerosol Powder, Breath Activtivatede, Inhale 1 puff into the lungs 2 (two) times daily.,  Disp: 1 each, Rfl: 11  .  glucagon 1 MG injection, Use as needed for hypoglycemia, Disp: 1 kit, Rfl: 1  .  glucose blood test strip, One touch ultra test strips. Use 1 strip 8 times a day, Disp: 250 each, Rfl: 11  .  insulin glargine (LANTUS) 100 UNIT/ML injection, Inject 24 Units into the skin nightly., Disp: 7.2 mL, Rfl: 11  .  insulin lispro (HUMALOG) 100 UNIT/ML injection, Use 100 units a day for Insulin pump, Disp: 40 mL, Rfl: 3  .  Insulin Syringe-Needle U-100 (BD INSULIN SYRINGE ULTRAFINE) 31G X 5/16" 0.5 ML Misc, Use 1 to inject insulin as needed, Disp: 50 each, Rfl: 0  .  Ketone Blood Test Strip, Use 1 as needed to check for urine ketones, Disp: 10 each, Rfl: 0  .  levonorgestrel (MIRENA) 20 MCG/24HR IUD, 1 each by Intrauterine route once., Disp: , Rfl:   .  methylPREDNISolone (MEDROL DOSPACK) 4 MG tablet, Take 1 tablet (4 mg total) by mouth daily. follow package directions, Disp: 21 tablet, Rfl: 0  .  Nebulizer Misc, 1 nebulizer machine kit with tubing. Dx: asthma, Disp: 1 each, Rfl: 0  .  Prenatal Vit-DSS-Fe Cbn-FA (PRENATAL VITAMIN) tablet, Take 1 tablet by mouth daily., Disp: , Rfl:   .  sertraline (ZOLOFT) 50 MG tablet, Take 1 tablet (50 mg total) by mouth daily., Disp: 30 tablet, Rfl: 5    Current facility-administered medications:   .  albuterol (PROVENTIL) nebulizer solution 2.5 mg, 2.5 mg, Nebulization, Once, Sobia Karger, Cathlyn Parsons, MD  Family and Social history reviewed.   Medications reviewed         BP 113/75 mmHg  Pulse 76  Temp(Src) 98.1 F (36.7 C) (Oral)  Ht 1.626 m (5\' 4" )  Wt 69.4 kg (153 lb)  BMI 26.25 kg/m2   Filed Vitals:    08/18/15 0944   BP: 113/75   Pulse: 76   Temp: 98.1 F (36.7 C)   TempSrc: Oral   Height: 1.626 m (5\' 4" )   Weight: 69.4 kg (153 lb)       Nursing note reviewed. Vital signs reviewed.           Objective:    Physical Exam   Constitutional: She is oriented to person, place, and time. She appears well-developed and well-nourished. No distress.   HENT:   Head:  Normocephalic and atraumatic.   Eyes: Conjunctivae are normal. Pupils are equal, round, and reactive to light.   Cardiovascular: Normal rate and regular rhythm.  Exam reveals no gallop and no friction rub.    Pulmonary/Chest: Effort normal and breath sounds normal. No respiratory distress. She has no wheezes.   Neurological: She is alert and oriented to person, place, and time.   Skin: Skin is warm and dry.   Psychiatric: She has a normal mood and affect. Her behavior is normal.       Assessment and Plan:  Allison Salazar was seen today for mild intermittent asthma without complication and medication refill.    Diagnoses and all orders for this visit:    Post partum depression  -     Discontinue: sertraline (ZOLOFT) 50 MG tablet; Take 1 tablet (50 mg total) by mouth daily.  -     sertraline (ZOLOFT) 50 MG tablet; Take 1 tablet (50 mg total) by mouth daily.    Mild intermittent asthma without complication  -     fluticasone-salmeterol (ADVAIR DISKUS) 250-50 MCG/DOSE Aerosol Powder, Breath Activtivatede; Inhale 1 puff into the lungs 2 (two) times daily.    Other orders  -     Cancel: sertraline (ZOLOFT) 50 MG tablet; Take 1 tablet (50 mg total) by mouth daily.            Pp depression -- stable/improved. Cont zoloft.   ashtma exacerbation --- resolved. Cont advair.           Return in about 6 months (around 02/15/2016).             Procedures

## 2015-08-18 NOTE — Progress Notes (Signed)
Have you sought care outside of [place] since we last saw you?   No

## 2015-08-25 ENCOUNTER — Encounter (INDEPENDENT_AMBULATORY_CARE_PROVIDER_SITE_OTHER): Payer: Self-pay | Admitting: Endocrinology, Diabetes and Metabolism

## 2015-09-02 ENCOUNTER — Encounter (INDEPENDENT_AMBULATORY_CARE_PROVIDER_SITE_OTHER): Payer: Self-pay | Admitting: Endocrinology, Diabetes and Metabolism

## 2015-09-20 ENCOUNTER — Other Ambulatory Visit (INDEPENDENT_AMBULATORY_CARE_PROVIDER_SITE_OTHER): Payer: Self-pay | Admitting: Endocrinology, Diabetes and Metabolism

## 2015-09-20 DIAGNOSIS — E109 Type 1 diabetes mellitus without complications: Secondary | ICD-10-CM

## 2015-09-20 MED ORDER — INSULIN LISPRO 100 UNIT/ML SC SOLN
SUBCUTANEOUS | Status: DC
Start: 2015-09-20 — End: 2016-03-06

## 2015-09-20 NOTE — Telephone Encounter (Signed)
Refill    humalog vial 100units a day 4vials per refill    [facility] [phone] opt2    [phone] husband can be reached at

## 2015-09-23 ENCOUNTER — Encounter (INDEPENDENT_AMBULATORY_CARE_PROVIDER_SITE_OTHER): Payer: Self-pay | Admitting: Endocrinology, Diabetes and Metabolism

## 2015-09-30 ENCOUNTER — Ambulatory Visit (INDEPENDENT_AMBULATORY_CARE_PROVIDER_SITE_OTHER): Admitting: Internal Medicine

## 2015-09-30 ENCOUNTER — Encounter (INDEPENDENT_AMBULATORY_CARE_PROVIDER_SITE_OTHER): Payer: Self-pay | Admitting: Internal Medicine

## 2015-09-30 VITALS — BP 117/77 | HR 130 | Temp 98.3°F | Resp 16 | Ht 65.0 in | Wt 158.6 lb

## 2015-09-30 DIAGNOSIS — J029 Acute pharyngitis, unspecified: Secondary | ICD-10-CM

## 2015-09-30 DIAGNOSIS — J069 Acute upper respiratory infection, unspecified: Secondary | ICD-10-CM

## 2015-09-30 DIAGNOSIS — H9201 Otalgia, right ear: Secondary | ICD-10-CM

## 2015-09-30 MED ORDER — AMOXICILLIN-POT CLAVULANATE 875-125 MG PO TABS
1.0000 | ORAL_TABLET | Freq: Two times a day (BID) | ORAL | Status: AC
Start: 2015-09-30 — End: 2015-10-10

## 2015-09-30 NOTE — Progress Notes (Signed)
Subjective:       Patient ID: Allison Salazar is a 29 y.o. female.    HPI  Pt c/o  Right ear pain x 3d, +reduced hearing, no fever,   ?ST right side,  +nasal congestion  Slight cough, no wheezing,   otc sudafed, cold meds,   The following portions of the patient's history were reviewed and updated as appropriate: allergies, current medications, past family history, past medical history, past social history, past surgical history and problem list.    Review of Systems   Constitutional: Negative for fever and chills.   HENT: Positive for congestion and ear pain. Negative for ear discharge and tinnitus.    Respiratory: Negative for cough and shortness of breath.    Cardiovascular: Negative for chest pain and palpitations.   Gastrointestinal: Negative for nausea, vomiting, abdominal pain and diarrhea.     BP 117/77 mmHg  Pulse 130  Temp(Src) 98.3 F (36.8 C) (Oral)  Resp 16  Ht 1.651 m (5\' 5" )  Wt 71.94 kg (158 lb 9.6 oz)  BMI 26.39 kg/m2  Breastfeeding? No       Objective:    Physical Exam   Constitutional: She appears well-developed. No distress.   HENT:   Right Ear: Tympanic membrane is bulging. Tympanic membrane is not erythematous.   Left Ear: Tympanic membrane is not erythematous and not bulging.   Nose: Right sinus exhibits no maxillary sinus tenderness and no frontal sinus tenderness. Left sinus exhibits no maxillary sinus tenderness and no frontal sinus tenderness.   Mouth/Throat: No oropharyngeal exudate.   +mild erythema oropharynx, no exudate   Eyes: Conjunctivae are normal. No scleral icterus.   Neck: Neck supple. No JVD present. Carotid bruit is not present. No thyromegaly present.   +mild tender cervical LN   Cardiovascular: Normal rate, regular rhythm and normal heart sounds.  Exam reveals no gallop and no friction rub.    No murmur heard.  Pulmonary/Chest: Effort normal and breath sounds normal. No respiratory distress. She has no wheezes. She has no rales.   Lymphadenopathy:     She has cervical  adenopathy.   Neurological: She is alert.           Assessment:       1. Upper respiratory tract infection, unspecified type  amoxicillin-clavulanate (AUGMENTIN) 875-125 MG per tablet   2. Right ear pain  amoxicillin-clavulanate (AUGMENTIN) 875-125 MG per tablet   3. Pharyngitis, unspecified etiology  Throat Culture           Plan:      Procedures    Throat cx obtained.   Oral hydration, rest, otc decongestants, flonase, tylenol prn  rx augmentin   F/u if sx persistent.

## 2015-10-05 ENCOUNTER — Encounter (INDEPENDENT_AMBULATORY_CARE_PROVIDER_SITE_OTHER): Payer: Self-pay | Admitting: Internal Medicine

## 2015-11-12 ENCOUNTER — Ambulatory Visit (INDEPENDENT_AMBULATORY_CARE_PROVIDER_SITE_OTHER): Payer: Self-pay | Admitting: Endocrinology, Diabetes and Metabolism

## 2015-11-29 ENCOUNTER — Encounter (INDEPENDENT_AMBULATORY_CARE_PROVIDER_SITE_OTHER): Payer: Self-pay

## 2015-12-13 ENCOUNTER — Other Ambulatory Visit (INDEPENDENT_AMBULATORY_CARE_PROVIDER_SITE_OTHER): Payer: Self-pay | Admitting: Family Medicine

## 2015-12-17 ENCOUNTER — Ambulatory Visit (INDEPENDENT_AMBULATORY_CARE_PROVIDER_SITE_OTHER): Payer: Self-pay | Admitting: Endocrinology, Diabetes and Metabolism

## 2015-12-17 ENCOUNTER — Ambulatory Visit (INDEPENDENT_AMBULATORY_CARE_PROVIDER_SITE_OTHER): Admitting: Endocrinology, Diabetes and Metabolism

## 2016-01-12 ENCOUNTER — Ambulatory Visit (INDEPENDENT_AMBULATORY_CARE_PROVIDER_SITE_OTHER): Admitting: Endocrinology, Diabetes and Metabolism

## 2016-01-12 ENCOUNTER — Encounter (INDEPENDENT_AMBULATORY_CARE_PROVIDER_SITE_OTHER): Payer: Self-pay | Admitting: Endocrinology, Diabetes and Metabolism

## 2016-01-12 VITALS — BP 118/74 | HR 102 | Ht 64.0 in | Wt 165.2 lb

## 2016-01-12 DIAGNOSIS — R7989 Other specified abnormal findings of blood chemistry: Secondary | ICD-10-CM

## 2016-01-12 DIAGNOSIS — D510 Vitamin B12 deficiency anemia due to intrinsic factor deficiency: Secondary | ICD-10-CM

## 2016-01-12 DIAGNOSIS — E1065 Type 1 diabetes mellitus with hyperglycemia: Secondary | ICD-10-CM

## 2016-01-12 DIAGNOSIS — Z1329 Encounter for screening for other suspected endocrine disorder: Secondary | ICD-10-CM

## 2016-01-12 NOTE — Progress Notes (Signed)
Subjective:       Patient ID: Allison Salazar is a 30 y.o. female.    HPI  30 year old woman with type 1 DM on insulin pump.  She is now on T slim pump for the last 2-3 months. She does not have any problems with that. Also using dexcom G5. The 2 devices are not yet integrated.  She was on OCs for 6 months and noted her sugars were higher while she was on it and had other side effects like bloating, breakthrough bleeding, also tried mirena with same issues. She has gained 8 lb over the last 3 months.    Pump download and Dexcom reviewed, shows higher sugars between3 and 6 am with occasional lows, she reports severe hypoglycemia a month ago at 30 overnight.   Complications:  Last ophthal in jan 2017. No retinopathy.  Sees dermatology for basal cell Ca every 4 to 6 months.  No sx of neuropathy. Normal foot sensory 09/2014  -ve microalbuminuria in 09/2014    Pernicious anemia: s/p Vitamin B12 injections. Not on oral supplements.    Sister has recently diagnosed with a thyroid problem.    The following portions of the patient's history were reviewed and updated as appropriate: allergies, current medications, past family history, past medical history, past social history, past surgical history and problem list.    Review of Systems   Constitutional: Positive for fatigue. Negative for unexpected weight change.   Eyes: Negative for visual disturbance.   Endocrine: Negative for polydipsia, polyphagia and polyuria.   Neurological: Negative for numbness.   All other systems reviewed and are negative.    BP 118/74 mmHg  Pulse 102  Ht 1.626 m (5\' 4" )  Wt 74.934 kg (165 lb 3.2 oz)  BMI 28.34 kg/m2        Objective:    Physical Exam   Constitutional: She is oriented to person, place, and time. She appears well-developed.   HENT:   Head: Normocephalic and atraumatic.   Eyes: Conjunctivae are normal. No scleral icterus.   Neck: Neck supple. No thyromegaly present.   Cardiovascular: Normal rate, regular rhythm and normal heart  sounds.  Exam reveals no gallop and no friction rub.    No murmur heard.  Pulmonary/Chest: Effort normal and breath sounds normal. No respiratory distress. She has no wheezes.   Neurological: She is alert and oriented to person, place, and time.   Skin:   No ulcers or calluses in b/l feet   Nursing note and vitals reviewed.    Labs reviewed as under:    Component      Latest Ref Rng 08/11/2015   Glucose      70 - 100 mg/dL 536 (H)   BUN      7.0 - 19.0 mg/dL 64.4   Creatinine      0.4 - 1.5 mg/dL 0.8   Sodium      034 - 146 mEq/L 139   Potassium      3.5 - 5.3 mEq/L 3.9   Chloride      100 - 111 mEq/L 104   CO2      21 - 30 mEq/L 24   Calcium      8.5 - 10.5 mg/dL 9.0   Protein, Total      6.0 - 8.3 g/dL 6.8   Albumin      3.5 - 5.0 g/dL 4.0   AST (SGOT)      5 - 34 U/L 17  ALT      0 - 55 U/L 13   Alkaline Phosphatase      37 - 106 U/L 94   Bilirubin, Total      0.1 - 1.2 mg/dL 0.7   Globulin      2.0 - 3.7 g/dL 2.8   Albumin/Globulin Ratio      0.9 - 2.2 1.4   Cholesterol      0 - 199 mg/dL 308 (H)   Triglycerides      34 - 149 mg/dL 54   HDL      40 - 6578 mg/dL 97   Calculated LDL      0 - 99 mg/dL 469 (H)   VLDL Cholesterol Cal      10 - 40 mg/dL 11   CHOL/HDL Ratio      See Below 2.3   Hemoglobin A1C      4.6 - 5.9 % 6.9 (H)   Average Estimated Glucose       151.3         Assessment:       1. Uncontrolled Type 1 diabetes mellitus   2. Pernicious anemia  3. High LDL      Plan:      Procedures    1. Continue current pump settings.  Check fs 6-8 times daily.  Complications:  Maintain annual ophthal appointment. Foot care reviewed. -ve microalbuminuria in 09/2014  2. Pt to take b12 OTC supplements po 1000 mcg.  3. Check lipid panel.  Labs ordered.  RTC 3 mo

## 2016-03-06 ENCOUNTER — Other Ambulatory Visit (INDEPENDENT_AMBULATORY_CARE_PROVIDER_SITE_OTHER): Payer: Self-pay | Admitting: Endocrinology, Diabetes and Metabolism

## 2016-03-06 DIAGNOSIS — E109 Type 1 diabetes mellitus without complications: Secondary | ICD-10-CM

## 2016-03-06 MED ORDER — INSULIN LISPRO 100 UNIT/ML SC SOLN
SUBCUTANEOUS | Status: DC
Start: 2016-03-06 — End: 2016-12-19

## 2016-03-06 NOTE — Telephone Encounter (Signed)
Patient's husband called to request refill for insulin lispro (HUMALOG) 100 UNIT/ML injection for insulin pump    [facility] - [facility] [place], [place] - [address] [facility]

## 2016-03-06 NOTE — Telephone Encounter (Signed)
Rx sent to Dr Donette Larry for approval.

## 2016-04-16 ENCOUNTER — Other Ambulatory Visit (INDEPENDENT_AMBULATORY_CARE_PROVIDER_SITE_OTHER): Payer: Self-pay | Admitting: Family Medicine

## 2016-06-26 LAB — RUBELLA ANTIBODY, IGG: Rubella AB, IgG: IMMUNE

## 2016-06-26 LAB — RPR: RPR: NONREACTIVE

## 2016-06-26 LAB — GONOCOCCUS CULTURE: Culture Gonorrhoeae: NEGATIVE

## 2016-06-26 LAB — HEPATITIS B SURFACE ANTIGEN W/ REFLEX TO CONFIRMATION: Hepatitis B Surface Antigen: NEGATIVE

## 2016-06-26 LAB — HIV AG/AB 4TH GENERATION: HIV Ag/Ab, 4th Generation: NONREACTIVE

## 2016-07-10 ENCOUNTER — Other Ambulatory Visit (HOSPITAL_BASED_OUTPATIENT_CLINIC_OR_DEPARTMENT_OTHER): Payer: Self-pay | Admitting: Advanced Practice Midwife

## 2016-07-10 DIAGNOSIS — E538 Deficiency of other specified B group vitamins: Secondary | ICD-10-CM

## 2016-07-10 DIAGNOSIS — E108 Type 1 diabetes mellitus with unspecified complications: Secondary | ICD-10-CM

## 2016-07-11 ENCOUNTER — Encounter (HOSPITAL_BASED_OUTPATIENT_CLINIC_OR_DEPARTMENT_OTHER): Payer: Self-pay

## 2016-07-18 ENCOUNTER — Inpatient Hospital Stay (HOSPITAL_BASED_OUTPATIENT_CLINIC_OR_DEPARTMENT_OTHER)
Admission: RE | Admit: 2016-07-18 | Discharge: 2016-07-18 | Disposition: A | Discharge status: Home / Self Care | Source: Ambulatory Visit | Attending: Advanced Practice Midwife | Admitting: Advanced Practice Midwife

## 2016-07-18 ENCOUNTER — Inpatient Hospital Stay (HOSPITAL_BASED_OUTPATIENT_CLINIC_OR_DEPARTMENT_OTHER)
Admission: RE | Admit: 2016-07-18 | Discharge: 2016-07-18 | Disposition: A | Discharge status: Home / Self Care | Source: Ambulatory Visit

## 2016-07-18 ENCOUNTER — Inpatient Hospital Stay
Admission: RE | Admit: 2016-07-18 | Discharge: 2016-07-18 | Disposition: A | Discharge status: Home / Self Care | Source: Ambulatory Visit | Attending: Advanced Practice Midwife | Admitting: Advanced Practice Midwife

## 2016-07-18 ENCOUNTER — Ambulatory Visit: Payer: Self-pay | Admitting: Obstetrics & Gynecology

## 2016-07-18 VITALS — BP 112/73 | HR 106 | Wt 156.3 lb

## 2016-07-18 DIAGNOSIS — O24012 Pre-existing diabetes mellitus, type 1, in pregnancy, second trimester: Secondary | ICD-10-CM

## 2016-07-18 DIAGNOSIS — Z9641 Presence of insulin pump (external) (internal): Secondary | ICD-10-CM | POA: Insufficient documentation

## 2016-07-18 DIAGNOSIS — O34219 Maternal care for unspecified type scar from previous cesarean delivery: Secondary | ICD-10-CM | POA: Insufficient documentation

## 2016-07-18 DIAGNOSIS — J4520 Mild intermittent asthma, uncomplicated: Secondary | ICD-10-CM

## 2016-07-18 DIAGNOSIS — E538 Deficiency of other specified B group vitamins: Secondary | ICD-10-CM

## 2016-07-18 DIAGNOSIS — O9989 Other specified diseases and conditions complicating pregnancy, childbirth and the puerperium: Secondary | ICD-10-CM

## 2016-07-18 DIAGNOSIS — Z3A15 15 weeks gestation of pregnancy: Secondary | ICD-10-CM | POA: Insufficient documentation

## 2016-07-18 DIAGNOSIS — O99512 Diseases of the respiratory system complicating pregnancy, second trimester: Secondary | ICD-10-CM | POA: Insufficient documentation

## 2016-07-18 DIAGNOSIS — D510 Vitamin B12 deficiency anemia due to intrinsic factor deficiency: Secondary | ICD-10-CM | POA: Insufficient documentation

## 2016-07-18 DIAGNOSIS — Z7189 Other specified counseling: Secondary | ICD-10-CM | POA: Insufficient documentation

## 2016-07-18 DIAGNOSIS — O99519 Diseases of the respiratory system complicating pregnancy, unspecified trimester: Secondary | ICD-10-CM

## 2016-07-18 DIAGNOSIS — Z98891 History of uterine scar from previous surgery: Secondary | ICD-10-CM

## 2016-07-18 DIAGNOSIS — J45909 Unspecified asthma, uncomplicated: Secondary | ICD-10-CM

## 2016-07-18 DIAGNOSIS — O24019 Pre-existing diabetes mellitus, type 1, in pregnancy, unspecified trimester: Secondary | ICD-10-CM

## 2016-07-18 DIAGNOSIS — E108 Type 1 diabetes mellitus with unspecified complications: Secondary | ICD-10-CM

## 2016-07-18 DIAGNOSIS — E109 Type 1 diabetes mellitus without complications: Secondary | ICD-10-CM

## 2016-07-18 DIAGNOSIS — Z794 Long term (current) use of insulin: Secondary | ICD-10-CM | POA: Insufficient documentation

## 2016-07-18 DIAGNOSIS — O99012 Anemia complicating pregnancy, second trimester: Secondary | ICD-10-CM | POA: Insufficient documentation

## 2016-07-19 ENCOUNTER — Encounter: Payer: Self-pay | Admitting: Obstetrics & Gynecology

## 2016-07-19 NOTE — Progress Notes (Signed)
MFM Consultation Note    Allison Salazar  [id number]  Referring Provider: Ms. Valetta Mole, CNM    07/18/16    Dear Ms. Vega:    I had the pleasure of seeing Allison Salazar for a maternal-fetal medicine consultation today.  Thank you very much for the referral.    As you know, Allison Salazar is a very pleasant 30 y.o. [id number] at [redacted]w[redacted]d by LMP (04/04/16) c/w 11w 6d ultrasound, EDC 01/09/17.  She is being seen in consultation today for Type I DM and pernicious anemia (vit B12 deficiency).  She attended today's visit by herself.    She is feeling well and is without complaints.  Denies crampiness, loss of fluid, ,or vaginal bleeding.  Too early to appreciate fetal movement at this time.    We spent several minutes discussing her Type I diabetes mellitus, which was diagnosed at age 6.  She is very well-informed and comfortable with the use of her humalog insulin pump and continuous glucose monitor.  She does not have any known diabetes sequelae at this time.  She has successfully carried 3 pregnancies to term without diabetes-related pregnancy complications.  She was living in [place] at the time of her prior pregnancies.  She sees an endocrinologist, Dr. Rene Paci, from the [facility] system currently, but has not yet seen Dr. Rene Paci this pregnancy.    I have reviewed and updated her medical, surgical, social, and family histories today.  I have also reviewed and updated her current medications and allergies.  The details are as follows:    Past Medical History:   Diagnosis Date   . Anemia    . Asthma, exercise induced 2015    NO inhaler use 47yrs   . Diabetes mellitus 1989    diagnosed at age 34, uses insulin pump   . Eczema 2015   . Headache(784.0) 2015    occ migraine   . History of blood transfusion 2012    long labor, [place]   . Impaired vision     contacts   . PONV (postoperative nausea and vomiting)    . Postpartum depression 2012    zoloft   . Pregnant state, incidental    . Seasonal allergies      Past Surgical History:   Procedure  Laterality Date   . APPENDECTOMY  2005    scope   . CESAREAN SECTION  2012 , 2014, 2015    FTP   . CESAREAN SECTION N/A 06/30/2014    Procedure: CESAREAN SECTION;  Surgeon: Loistine Chance, MD;  Location: [facility] OR;  Service: Obstetrics;  Laterality: N/A;   . MOLERS  2007   . WISDOM TOOTH EXTRACTION  2004,      Social History     Social History   . Marital status: Married     Spouse name: N/A   . Number of children: N/A   . Years of education: N/A     Occupational History   . Not on file.     Social History Main Topics   . Smoking status: Never Smoker   . Smokeless tobacco: Not on file   . Alcohol use No   . Drug use: No   . Sexual activity: Yes     Partners: Male     Birth control/ protection: Condom     Other Topics Concern   . 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      Family History   Problem Relation Age of Onset   . Heart attack Father 49   . Heart disease Father    . Hypertension Father    . Arthritis Father    . Hypertension Paternal Grandmother    . Hyperlipidemia Paternal Grandmother    . Kidney disease Paternal Grandmother    . Asthma Neg Hx    . Diabetes Neg Hx    . Stomach cancer Neg Hx    . Stroke Neg Hx    . Cancer Mother      CERVICAL   . Hypertension Paternal Grandfather    . Kidney disease Paternal Grandfather    . Heart attack Paternal Grandfather    . Heart disease Paternal Grandfather    . Cervical cancer Sister    . Cancer Sister      CERVICAL AND SKIN   . Kidney disease Paternal Uncle      KIDNEY TRANSPLANT   . Osteoporosis Maternal Grandmother      OB History     Gravida Para Term Preterm AB Living    6 3 3   2 3     SAB TAB Ectopic Multiple Live Births    2     0 3          Current Outpatient Prescriptions:   .  albuterol (PROVENTIL HFA;VENTOLIN HFA) 108 (90 BASE) MCG/ACT inhaler, Inhale 2 puffs into the lungs every 6 (six) hours as needed for Wheezing., Disp: , Rfl:   .  albuterol (PROVENTIL) (2.5 MG/3ML) 0.083% nebulizer  solution, Take 3 mLs (2.5 mg total) by nebulization every 4 (four) hours as needed for Wheezing or Shortness of Breath., Disp: 25 each, Rfl: 5  .  fluticasone-salmeterol (ADVAIR DISKUS) 250-50 MCG/DOSE Aerosol Powder, Breath Activtivatede, Inhale 1 puff into the lungs 2 (two) times daily., Disp: 1 each, Rfl: 11  .  FOLIC ACID PO, Take by mouth., Disp: , Rfl:   .  glucagon 1 MG injection, Use as needed for hypoglycemia, Disp: 1 kit, Rfl: 1  .  glucose blood test strip, One touch ultra test strips. Use 1 strip 8 times a day, Disp: 250 each, Rfl: 11  .  insulin lispro (HUMALOG) 100 UNIT/ML injection, Use 100 units a day for Insulin pump, Disp: 90 mL, Rfl: 1  .  Insulin Syringe-Needle U-100 (BD INSULIN SYRINGE ULTRAFINE) 31G X 5/16" 0.5 ML Misc, Use 1 to inject insulin as needed, Disp: 50 each, Rfl: 0  .  Ketone Blood Test Strip, Use 1 as needed to check for urine ketones, Disp: 10 each, Rfl: 0  .  Nebulizer Misc, 1 nebulizer machine kit with tubing. Dx: asthma, Disp: 1 each, Rfl: 0  .  Prenatal Vit-DSS-Fe Cbn-FA (PRENATAL VITAMIN) tablet, Take 1 tablet by mouth daily., Disp: , Rfl:   .  sertraline (ZOLOFT) 50 MG tablet, TAKE 1 TABLET BY MOUTH EVERY DAY, Disp: 30 tablet, Rfl: 5    Current Facility-Administered Medications:   .  albuterol (PROVENTIL) nebulizer solution 2.5 mg, 2.5 mg, Nebulization, Once, [place], Cathlyn Parsons, MD  Allergies   Allergen Reactions   . Latex Dermatitis        Pertinent items are noted in HPI.  Otherwise a comprehensive review of systems was negative.    Physical exam:  BP 112/73   Pulse (!) 106   Wt 156 lb 4.8 oz   LMP 04/04/2016 (Exact Date) Comment: c/w 11w 6d [place]  BMI 26.83 kg/m  A physical exam was not completed at today's visit.    Assessment:  Allison Salazar is a 30 y.o. [id number] at [redacted]w[redacted]d by LMP (04/04/16) c/w [redacted]w[redacted]d ultrasound, EDC 01/09/17.  This pregnancy is complicated by Type I diabetes melllitus, vitamin B12 deficiency, and mild intermittent asthma.  She has also had 3 prior  c-sections.    Plan:  1)  Type I Diabetes Mellitus:  - Well-controlled.  Labs reviewed, and Hgb A1c on 06/26/16 was 5.2%.  Normal thyroid function tests.  - Mrs. Zulueta is very knowledgeable and comfortable with diabetes and management of her insulin pump, corrections for blood glucose levels, and eating properly.  - Current insulin pump settings are as follows:  12A-6P basal rate of 1 unit, with a correction factor of 1:35 and a carb ratio of 1:10-1:14 depending upon the day (she routinely checks and self-adjusts insulin).  6P-12A basal rate of 0.8 units, with a correction factor of 1:40 and the same carb ratio of 1:10-1:14.    - She has been having increasing nausea at nighttime with this pregnancy, and has had a few hypoglycemic episodes because of this therefore has set a target blood sugar of 120 overnight to avoid hypoglycemia.  - She is able to give medical providers electronic access to her continuous glucose monitor values.  - We discussed risks to pregnancy with poorly controlled diabetes.  She understands and has had three successful pregnancies without diabetes-related complications.  - Encouraged her to set up an appointment to see her endocrinologist in the next couple of weeks ( Dr. Rene Paci does not know Haya is pregnant yet).  - Recommend obtaining baseline preeclampsia labs including a 24-hour urine protein collection.  - Recommend evaluation by ophthalmology this pregnancy for proliferative retinopathy, which can be progressive in pregnancy.  - Recommend baseline EKG.  - Recommend Level II ultrasound at around 20 weeks.  - Recommend serial fetal growth assessments throughout pregnancy, approximately every 4 weeks.  - Recommend fetal echocardiogram at 22-24 weeks.  - Antenatal fetal surveillance to be started at 32 weeks of gestation.  - Patient understands that delivery timing will be based upon clinical circumstances as pregnancy progresses.    2)  Pernicious anemia (vitamin B12 deficiency):  -  Improved with serial B12 injections during 2016.  She now takes OTC vitamin B12 tabs daily.  On 06/26/16, her Vit B12 level was normal at 247 (lab reference range is 211-911), H/H were normal at 12.1/36.5, and MCV was normal at 89.7.  - Continue daily B12 supplementation.  - Recommend checking CBC and vitamin B12 levels every trimester this pregnancy    3)  Mild intermittent asthma:  - Triggered by exercise and cold.  Well-controlled, rarely uses albuterol inhaler.  Has never required intubation, but has been admitted to the hospital for nebulizer treatments in the past.  - Continue albuterol PRN.    I spent a total of 60 minutes in face-to-face consultation and coordination of care today.  At the time of her consultation, the patient's questions were answered to her satisfaction and the plan of care was reviewed with her in detail.      We are happy to assist in the care of Marcos Peloso as you desire during this pregnancy.  Thank you again for the referral.  Please do not hesitate to contact me with any questions.    Respectfully,  Bishop Limbo, DO  [phone]

## 2016-08-25 ENCOUNTER — Inpatient Hospital Stay
Admission: RE | Admit: 2016-08-25 | Discharge: 2016-08-25 | Disposition: A | Discharge status: Home / Self Care | Source: Ambulatory Visit | Attending: Advanced Practice Midwife | Admitting: Advanced Practice Midwife

## 2016-08-25 DIAGNOSIS — E109 Type 1 diabetes mellitus without complications: Secondary | ICD-10-CM

## 2016-08-25 DIAGNOSIS — Z98891 History of uterine scar from previous surgery: Secondary | ICD-10-CM

## 2016-08-25 DIAGNOSIS — Z369 Encounter for antenatal screening, unspecified: Secondary | ICD-10-CM

## 2016-08-25 DIAGNOSIS — O26892 Other specified pregnancy related conditions, second trimester: Secondary | ICD-10-CM

## 2016-08-25 DIAGNOSIS — E538 Deficiency of other specified B group vitamins: Secondary | ICD-10-CM

## 2016-08-25 DIAGNOSIS — E108 Type 1 diabetes mellitus with unspecified complications: Secondary | ICD-10-CM

## 2016-08-25 DIAGNOSIS — O359XX0 Maternal care for (suspected) fetal abnormality and damage, unspecified, not applicable or unspecified: Secondary | ICD-10-CM | POA: Insufficient documentation

## 2016-08-25 DIAGNOSIS — O24012 Pre-existing diabetes mellitus, type 1, in pregnancy, second trimester: Secondary | ICD-10-CM | POA: Insufficient documentation

## 2016-08-25 DIAGNOSIS — Z3A20 20 weeks gestation of pregnancy: Secondary | ICD-10-CM | POA: Insufficient documentation

## 2016-09-11 ENCOUNTER — Encounter (INDEPENDENT_AMBULATORY_CARE_PROVIDER_SITE_OTHER): Payer: Self-pay | Admitting: Endocrinology, Diabetes and Metabolism

## 2016-09-11 ENCOUNTER — Ambulatory Visit (INDEPENDENT_AMBULATORY_CARE_PROVIDER_SITE_OTHER): Admitting: Endocrinology, Diabetes and Metabolism

## 2016-09-11 VITALS — BP 125/74 | HR 101 | Ht 64.0 in | Wt 162.2 lb

## 2016-09-11 DIAGNOSIS — E10649 Type 1 diabetes mellitus with hypoglycemia without coma: Secondary | ICD-10-CM

## 2016-09-11 DIAGNOSIS — Z3492 Encounter for supervision of normal pregnancy, unspecified, second trimester: Secondary | ICD-10-CM

## 2016-09-11 DIAGNOSIS — D510 Vitamin B12 deficiency anemia due to intrinsic factor deficiency: Secondary | ICD-10-CM

## 2016-09-11 NOTE — Progress Notes (Addendum)
Subjective:       Patient ID: Allison Salazar is a 30 y.o. female.    HPI  30 year old woman with pernicious anemia, type 1 DM on insulin pump pregnant at [redacted] weeks gestation.    Type 1 DM during pregnancy:  Currently at [redacted] weeks gestation, last seen 9 months ago. This is her first visit during this pregnancy.  Last a1c 5.2% in 9.2017.  She has been following with her OB, Dr Allison Salazar.  She is on T slim insulin pump with dexcom G5.   Complications:  Last ophthal in Nov 2017. No retinopathy.  No sx of neuropathy. Normal foot sensory 09/2014  -ve microalbuminuria in 09/2014  In previous pregnancies, she reports relative insulin resistance at [redacted] weeks gestation necessitating increase in insulin.  She is following with OB and maternal fetal medicine.   She checks ketones weekly.   She has good understanding of the insulin pump  Baby is growing well     Pump settings  Basal 12 am 1.2  2pm 1.3  6pm 1  I:C 8  Target 100  ISF 35    Unable to download pump and Dexcom. Several attempts were made to download the pump including contacting helpdesk to troubleshoot, appears to be an issue with her pump. Dexcom download on patient's phone shows data from sep/Oct 2017, no recent data available for review.     Hypoglycemia - Having hypoglycemic events associated with night time nausea and vomiting up to 2-3 nights a week into 40s. During the first trimester, she was vomiting more frequently, she has been on different anti nausea meds. She used glucagon injection at [redacted] weeks gestation due to intolerable vomiting.     Pernicious anemia: s/p Vitamin B12 injections. Currently on B12 supplements 200 mcg daily.     The following portions of the patient's history were reviewed and updated as appropriate: allergies, current medications, past family history, past medical history, past social history, past surgical history and problem list.    Review of Systems   Constitutional: Negative for unexpected weight change.   Eyes: Negative for visual  disturbance.   Gastrointestinal: Positive for nausea.   Neurological: Negative for numbness.   All other systems reviewed and are negative.    BP 125/74   Pulse (!) 101   Ht 1.626 m (5\' 4" )   Wt 73.6 kg (162 lb 3.2 oz)   LMP 04/04/2016 (Exact Date) Comment: c/w 11w 6d [place]  BMI 27.84 kg/m       Objective:    Physical Exam   Constitutional: She is oriented to person, place, and time. She appears well-developed.   HENT:   Head: Normocephalic and atraumatic.   Eyes: Conjunctivae are normal. No scleral icterus.   Neck: Neck supple. No thyromegaly present.   Cardiovascular: Normal rate, regular rhythm and normal heart sounds.  Exam reveals no gallop and no friction rub.    No murmur heard.  Pulmonary/Chest: Effort normal and breath sounds normal. No respiratory distress. She has no wheezes.   Neurological: She is alert and oriented to person, place, and time.   Vibratory sense intact in bilateral feet    Skin:   No ulcers or calluses in b/l feet   Nursing note and vitals reviewed.    Labs reviewed as under:  a1c 5.2, B12 247       Assessment:       1. Uncontrolled Type 1 diabetes mellitus   2. Hypoglycemia  3. Pernicious anemia  4. 2nd trimester pregnancy       Plan:      Procedures    1. Uncontrolled Type 1 diabetes mellitus:  Continue current pump settings.  Pt to get int ouch with tandem to troubleshoot issue with pump inability to upload data.  Pt to share dexcom upload information. Any change to settings will be based on that.  Check fs 6-8 times daily.  Complications:  Maintain annual ophthal appointment. Foot care reviewed. Normal foot sensory 11.2017.     2. Hypoglycemia - Rule of 15 for hypoglycemia treatment reviewed.   Has UTD glucagon inj, medical ID.    3. Pernicious anemia : Continue OTC B12 supplements.    RTC 4 weeks    Vennie Homans MD

## 2016-09-12 ENCOUNTER — Encounter (INDEPENDENT_AMBULATORY_CARE_PROVIDER_SITE_OTHER): Payer: Self-pay | Admitting: Endocrinology, Diabetes and Metabolism

## 2016-09-12 ENCOUNTER — Ambulatory Visit (INDEPENDENT_AMBULATORY_CARE_PROVIDER_SITE_OTHER): Admitting: Endocrinology, Diabetes and Metabolism

## 2016-09-14 ENCOUNTER — Encounter (INDEPENDENT_AMBULATORY_CARE_PROVIDER_SITE_OTHER): Payer: Self-pay | Admitting: Endocrinology, Diabetes and Metabolism

## 2016-10-12 ENCOUNTER — Ambulatory Visit (FREE_STANDING_LABORATORY_FACILITY): Payer: Enrolled Prime—HMO | Admitting: Family Medicine

## 2016-10-12 ENCOUNTER — Encounter (INDEPENDENT_AMBULATORY_CARE_PROVIDER_SITE_OTHER): Payer: Self-pay | Admitting: Family Medicine

## 2016-10-12 VITALS — BP 105/71 | HR 107 | Temp 98.0°F | Wt 160.8 lb

## 2016-10-12 DIAGNOSIS — R05 Cough: Secondary | ICD-10-CM

## 2016-10-12 DIAGNOSIS — R824 Acetonuria: Secondary | ICD-10-CM

## 2016-10-12 DIAGNOSIS — R059 Cough, unspecified: Secondary | ICD-10-CM

## 2016-10-12 DIAGNOSIS — J4520 Mild intermittent asthma, uncomplicated: Secondary | ICD-10-CM

## 2016-10-12 DIAGNOSIS — Z3492 Encounter for supervision of normal pregnancy, unspecified, second trimester: Secondary | ICD-10-CM

## 2016-10-12 DIAGNOSIS — E109 Type 1 diabetes mellitus without complications: Secondary | ICD-10-CM

## 2016-10-12 LAB — HEMOLYSIS INDEX: Hemolysis Index: 1 (ref 0–18)

## 2016-10-12 LAB — BASIC METABOLIC PANEL
BUN: 4 mg/dL — ABNORMAL LOW (ref 7.0–19.0)
CO2: 23 mEq/L (ref 21–29)
Calcium: 8.9 mg/dL (ref 8.5–10.5)
Chloride: 103 mEq/L (ref 100–111)
Creatinine: 0.7 mg/dL (ref 0.4–1.5)
Glucose: 106 mg/dL — ABNORMAL HIGH (ref 70–100)
Potassium: 4.1 mEq/L (ref 3.5–5.1)
Sodium: 136 mEq/L (ref 136–145)

## 2016-10-12 LAB — URINALYSIS
Bilirubin, UA: NEGATIVE
Blood, UA: NEGATIVE
Glucose, UA: 100 — AB
Ketones UA: NEGATIVE
Leukocyte Esterase, UA: NEGATIVE
Nitrite, UA: NEGATIVE
Protein, UR: NEGATIVE
Specific Gravity UA: 1.009 (ref 1.001–1.035)
Urine pH: 6.5 (ref 5.0–8.0)
Urobilinogen, UA: 1

## 2016-10-12 LAB — GFR: EGFR: >60

## 2016-10-12 MED ORDER — BENZONATATE 100 MG PO CAPS
100.0000 mg | ORAL_CAPSULE | Freq: Three times a day (TID) | ORAL | 0 refills | Status: DC | PRN
Start: 2016-10-12 — End: 2016-12-28

## 2016-10-12 NOTE — Progress Notes (Signed)
Subjective:      Patient ID: Allison Salazar  is a 30 y.o.  female.     Chief Complaint   Patient presents with   . Cough     pt states x 11 days, did dipstix test with high keton level, [redacted] wk pregnant,       HPI  Here to discuss about ongoing cough and ketonuria.    Pt is type 1 diabetic with insulin pump and currently [redacted] wks pregnant, uncomplicated so far.   Was seen by OBGYN this morning and regular urine dip was positive for Ketones, advised to go to ED.   Pt was diagnosed at age of 27 and had a few episodes of DKAs and aware of what DKA was like.   Her sugar has been stable around 130's and currently denies any symptoms but ongoing cough.     Cough started since 11 days ago, felt febrile and cold at the beginning but currently having lingering cough spell which hasn't gotten any better.   It gets worse at night and nothing has helped, tried OTC mucinex, rotibussin, cough drop w/o much alleviation.   Has been using albuterol 1~2 times a day but denies SOB.     Got a flu shot for this season.     Pt tried CSI in the past which elevated her sugar up to 300's, prefers not to use it unless it is really necessary.     No problems updated.       Review of Systems   Constitutional: Negative for chills and fever.   HENT: Positive for congestion and voice change. Negative for sore throat and trouble swallowing.    Respiratory: Positive for cough. Negative for shortness of breath.    Cardiovascular: Negative.  Negative for chest pain.   Gastrointestinal: Negative for abdominal pain, nausea and vomiting.   Neurological: Negative for light-headedness and headaches.      10 point review of systems were reviewed and are negative except as previously noted in HPI    The following portions of the patient's history were reviewed and updated as appropriate: current medications, allergies, past family history, past medical history, past social history, past surgical history and problem list.     Current Outpatient Prescriptions    Medication Sig Dispense Refill   . albuterol (PROVENTIL HFA;VENTOLIN HFA) 108 (90 BASE) MCG/ACT inhaler Inhale 2 puffs into the lungs every 6 (six) hours as needed for Wheezing.     Marland Kitchen aspirin 81 MG tablet Take 81 mg by mouth daily.     Marland Kitchen glucagon 1 MG injection Use as needed for hypoglycemia 1 kit 1   . glucose blood test strip One touch ultra test strips. Use 1 strip 8 times a day 250 each 11   . insulin lispro (HUMALOG) 100 UNIT/ML injection Use 100 units a day for Insulin pump 90 mL 1   . Insulin Syringe-Needle U-100 (BD INSULIN SYRINGE ULTRAFINE) 31G X 5/16" 0.5 ML Misc Use 1 to inject insulin as needed 50 each 0   . Ketone Blood Test Strip Use 1 as needed to check for urine ketones 10 each 0   . Prenat-FeFum-Fered-FA-DHA w/oA (PRENA1 PEARL) 30-1.4-200 MG Cap CR TK 1 C PO QD  5   . benzonatate (TESSALON) 100 MG capsule Take 1 capsule (100 mg total) by mouth 3 (three) times daily as needed for Cough. 30 capsule 0     Current Facility-Administered Medications   Medication Dose Route Frequency Provider Last  Rate Last Dose   . albuterol (PROVENTIL) nebulizer solution 2.5 mg  2.5 mg Nebulization Once Greta Doom, MD         Social History   Substance Use Topics   . Smoking status: Never Smoker   . Smokeless tobacco: Never Used   . Alcohol use No     Family History   Problem Relation Age of Onset   . Heart attack Father 6   . Heart disease Father    . Hypertension Father    . Arthritis Father    . Coronary artery disease Father    . Hypertension Paternal Grandmother    . Hyperlipidemia Paternal Grandmother    . Kidney disease Paternal Grandmother    . Cancer Mother      CERVICAL   . Hypertension Paternal Grandfather    . Kidney disease Paternal Grandfather    . Heart attack Paternal Grandfather    . Heart disease Paternal Grandfather    . Cervical cancer Sister    . Cancer Sister      CERVICAL AND SKIN   . Melanoma Sister    . Kidney disease Paternal Uncle      KIDNEY TRANSPLANT   . Osteoporosis Maternal  Grandmother    . Asthma Neg Hx    . Diabetes Neg Hx    . Stomach cancer Neg Hx    . Stroke Neg Hx      Past Surgical History:   Procedure Laterality Date   . APPENDECTOMY  2005    scope   . CESAREAN SECTION  2012 , 2014, 2015    FTP   . CESAREAN SECTION N/A 06/30/2014    Procedure: CESAREAN SECTION;  Surgeon: Loistine Chance, MD;  Location: [facility] OR;  Service: Obstetrics;  Laterality: N/A;   . MOLERS  2007   . WISDOM TOOTH EXTRACTION  2004,         Objective:   BP 105/71 (BP Site: Right arm, Patient Position: Sitting, Cuff Size: Medium)   Pulse (!) 107   Temp 98 F (36.7 C) (Oral)   Wt 72.9 kg (160 lb 12.8 oz)   LMP 04/04/2016 (Exact Date) Comment: c/w 11w 6d [place]  BMI 27.60 kg/m    Body mass index is 27.6 kg/m.  Physical Exam   Constitutional: She is oriented to person, place, and time. No distress.   HENT:   Right Ear: External ear normal.   Left Ear: External ear normal.   Nose: Nose normal.   Mouth/Throat: Oropharynx is clear and moist.   Eyes: Conjunctivae and EOM are normal. Pupils are equal, round, and reactive to light.   Cardiovascular: Normal rate, regular rhythm and normal heart sounds.    Pulmonary/Chest: Effort normal and breath sounds normal.   Abdominal: Soft. Bowel sounds are normal.   Lymphadenopathy:     She has no cervical adenopathy.   Neurological: She is alert and oriented to person, place, and time.   Psychiatric: She has a normal mood and affect. Her behavior is normal.   Nursing note and vitals reviewed.         Assessment and Plan:     1. Cough  benzonatate (TESSALON) 100 MG capsule   2. Type 1 diabetes mellitus without complication  Basic Metabolic Panel   3. Ketonuria  Urinalysis    Basic Metabolic Panel   4. Mild intermittent asthma without complication     5. Second trimester pregnancy  Cough is likely viral, limited options for cough, will try Tessalon w/ precaution which is class C, pt will stop using it if not effective.   No sign of asthma exacerbation, prn  albuterol neb.   Ketonuria- discussed about clinical symptoms may indicate DKA, pt verbalized understanding and will go to ED for any concern or symptoms. Repeat UA and BMP.     Risk & Benefits of any previous or new medication(s) were explained to the patient who verbalized understanding & agreed to the treatment plan.   Advise to call with updates/questions/concerns.

## 2016-10-13 ENCOUNTER — Other Ambulatory Visit (INDEPENDENT_AMBULATORY_CARE_PROVIDER_SITE_OTHER): Payer: Self-pay | Admitting: Family Medicine

## 2016-10-13 MED ORDER — AMOXICILLIN 875 MG PO TABS
875.0000 mg | ORAL_TABLET | Freq: Two times a day (BID) | ORAL | 0 refills | Status: DC
Start: 2016-10-13 — End: 2016-12-28

## 2016-10-13 NOTE — Progress Notes (Signed)
Informed result to pt.

## 2016-11-01 ENCOUNTER — Encounter (INDEPENDENT_AMBULATORY_CARE_PROVIDER_SITE_OTHER): Payer: Self-pay | Admitting: Endocrinology, Diabetes and Metabolism

## 2016-11-08 ENCOUNTER — Encounter: Payer: Self-pay | Admitting: Obstetrics & Gynecology

## 2016-11-08 NOTE — Progress Notes (Signed)
Called patient to review her fetal echocardiogram with her in detail.  She was a bit confused about yesterday's fetal echo, and wanted to review the results.    We discussed that the flow in the ductus arteriosus was somewhat elevated.  I reviewed with her different potential etiologies of this.  She has not been taking NSAIDs routinely, and understands to avoid them.  We discussed the function of the ductus arteriosus.    She has follow-up scheduled with pediatric cardiology in 5 weeks.  She has been followed by her primary obstetrician with serial fetal growth ultrasounds this pregnancy and growth has remained normal.  She will be having twice weekly antenatal fetal surveillance at her primary OB's office.    I discussed with Radiah having her possibly request to be seen here at the [facility] for [place] to perform a ductus arteriosus measurement and fetal growth evaluation in the next couple of weeks to provide additional reassurance before her next scheduled cardiology visit, if she desires.    Bishop Limbo, DO

## 2016-11-09 ENCOUNTER — Other Ambulatory Visit (INDEPENDENT_AMBULATORY_CARE_PROVIDER_SITE_OTHER): Payer: Self-pay | Admitting: Obstetrics and Gynecology

## 2016-11-09 DIAGNOSIS — E108 Type 1 diabetes mellitus with unspecified complications: Secondary | ICD-10-CM

## 2016-11-20 ENCOUNTER — Inpatient Hospital Stay
Admission: RE | Admit: 2016-11-20 | Discharge: 2016-11-20 | Disposition: A | Discharge status: Home / Self Care | Source: Ambulatory Visit | Attending: Obstetrics and Gynecology | Admitting: Obstetrics and Gynecology

## 2016-11-20 DIAGNOSIS — E108 Type 1 diabetes mellitus with unspecified complications: Secondary | ICD-10-CM

## 2016-11-20 DIAGNOSIS — O24013 Pre-existing diabetes mellitus, type 1, in pregnancy, third trimester: Secondary | ICD-10-CM

## 2016-11-20 DIAGNOSIS — E109 Type 1 diabetes mellitus without complications: Secondary | ICD-10-CM

## 2016-11-20 DIAGNOSIS — Z3A32 32 weeks gestation of pregnancy: Secondary | ICD-10-CM

## 2016-11-20 DIAGNOSIS — O09893 Supervision of other high risk pregnancies, third trimester: Secondary | ICD-10-CM

## 2016-11-20 DIAGNOSIS — Z98891 History of uterine scar from previous surgery: Secondary | ICD-10-CM

## 2016-12-19 ENCOUNTER — Other Ambulatory Visit (INDEPENDENT_AMBULATORY_CARE_PROVIDER_SITE_OTHER): Payer: Self-pay | Admitting: Endocrinology, Diabetes and Metabolism

## 2016-12-19 ENCOUNTER — Other Ambulatory Visit (INDEPENDENT_AMBULATORY_CARE_PROVIDER_SITE_OTHER): Payer: Self-pay | Admitting: Obstetrics and Gynecology

## 2016-12-19 DIAGNOSIS — O26849 Uterine size-date discrepancy, unspecified trimester: Secondary | ICD-10-CM

## 2016-12-19 DIAGNOSIS — O24011 Pre-existing diabetes mellitus, type 1, in pregnancy, first trimester: Secondary | ICD-10-CM

## 2016-12-19 DIAGNOSIS — E109 Type 1 diabetes mellitus without complications: Secondary | ICD-10-CM

## 2016-12-19 MED ORDER — INSULIN LISPRO 100 UNIT/ML SC SOLN
SUBCUTANEOUS | 0 refills | Status: DC
Start: 2016-12-19 — End: 2016-12-28

## 2016-12-19 NOTE — Telephone Encounter (Signed)
Patients husband called today 03/06 requesting a prescription refill.     Humalog 100 unit / ml injection.   [facility] pharmacy on file. Thanks     Last seen on 09/11/2016 no future apt scheduled. Aware that they need to schedule apt. [phone] patient cell phone.

## 2016-12-20 MED ORDER — GLUCAGON (RDNA) 1 MG IJ KIT
PACK | INTRAMUSCULAR | 2 refills | Status: DC
Start: 2016-12-20 — End: 2016-12-28

## 2016-12-21 ENCOUNTER — Other Ambulatory Visit (HOSPITAL_BASED_OUTPATIENT_CLINIC_OR_DEPARTMENT_OTHER)

## 2016-12-22 ENCOUNTER — Ambulatory Visit (HOSPITAL_BASED_OUTPATIENT_CLINIC_OR_DEPARTMENT_OTHER): Payer: Self-pay

## 2016-12-22 ENCOUNTER — Encounter (HOSPITAL_BASED_OUTPATIENT_CLINIC_OR_DEPARTMENT_OTHER): Payer: Self-pay

## 2016-12-22 LAB — HAS ZIKA TESTING BEEN PERFORMED

## 2016-12-26 ENCOUNTER — Encounter (HOSPITAL_BASED_OUTPATIENT_CLINIC_OR_DEPARTMENT_OTHER)
Admission: AD | Disposition: A | Discharge status: Home / Self Care | Payer: Self-pay | Source: Ambulatory Visit | Attending: Obstetrics & Gynecology

## 2016-12-26 ENCOUNTER — Observation Stay (HOSPITAL_BASED_OUTPATIENT_CLINIC_OR_DEPARTMENT_OTHER): Admitting: Anesthesiology

## 2016-12-26 ENCOUNTER — Inpatient Hospital Stay (HOSPITAL_BASED_OUTPATIENT_CLINIC_OR_DEPARTMENT_OTHER): Admitting: Obstetrics & Gynecology

## 2016-12-26 ENCOUNTER — Inpatient Hospital Stay
Admission: AD | Admit: 2016-12-26 | Discharge: 2016-12-29 | DRG: 765 | Disposition: A | Discharge status: Home / Self Care | Source: Ambulatory Visit | Attending: Obstetrics & Gynecology | Admitting: Obstetrics & Gynecology

## 2016-12-26 ENCOUNTER — Encounter (HOSPITAL_BASED_OUTPATIENT_CLINIC_OR_DEPARTMENT_OTHER): Payer: Self-pay

## 2016-12-26 DIAGNOSIS — Z9641 Presence of insulin pump (external) (internal): Secondary | ICD-10-CM | POA: Diagnosis present

## 2016-12-26 DIAGNOSIS — F53 Postpartum depression: Secondary | ICD-10-CM

## 2016-12-26 DIAGNOSIS — M5432 Sciatica, left side: Secondary | ICD-10-CM | POA: Diagnosis present

## 2016-12-26 DIAGNOSIS — Z3A38 38 weeks gestation of pregnancy: Secondary | ICD-10-CM

## 2016-12-26 DIAGNOSIS — O2402 Pre-existing diabetes mellitus, type 1, in childbirth: Secondary | ICD-10-CM | POA: Diagnosis present

## 2016-12-26 DIAGNOSIS — E109 Type 1 diabetes mellitus without complications: Secondary | ICD-10-CM | POA: Diagnosis present

## 2016-12-26 DIAGNOSIS — O34211 Maternal care for low transverse scar from previous cesarean delivery: Principal | ICD-10-CM | POA: Diagnosis present

## 2016-12-26 DIAGNOSIS — E10649 Type 1 diabetes mellitus with hypoglycemia without coma: Secondary | ICD-10-CM

## 2016-12-26 DIAGNOSIS — Z794 Long term (current) use of insulin: Secondary | ICD-10-CM

## 2016-12-26 LAB — GLUCOSE WHOLE BLOOD - POCT
Whole Blood Glucose POCT: 51 mg/dL — ABNORMAL LOW (ref 70–100)
Whole Blood Glucose POCT: 54 mg/dL — ABNORMAL LOW (ref 70–100)
Whole Blood Glucose POCT: 56 mg/dL — ABNORMAL LOW (ref 70–100)
Whole Blood Glucose POCT: 60 mg/dL — ABNORMAL LOW (ref 70–100)
Whole Blood Glucose POCT: 61 mg/dL — ABNORMAL LOW (ref 70–100)
Whole Blood Glucose POCT: 76 mg/dL (ref 70–100)
Whole Blood Glucose POCT: 87 mg/dL (ref 70–100)

## 2016-12-26 LAB — CBC
Absolute NRBC: 0 10*3/uL
Hematocrit: 35.6 % — ABNORMAL LOW (ref 37.0–47.0)
Hgb: 12.3 g/dL (ref 12.0–16.0)
MCH: 30.4 pg (ref 28.0–32.0)
MCHC: 34.6 g/dL (ref 32.0–36.0)
MCV: 88.1 fL (ref 80.0–100.0)
MPV: 9.7 fL (ref 9.4–12.3)
Nucleated RBC: 0 /100 WBC (ref 0.0–1.0)
Platelets: 237 10*3/uL (ref 140–400)
RBC: 4.04 10*6/uL — ABNORMAL LOW (ref 4.20–5.40)
RDW: 13 % (ref 12–15)
WBC: 7.11 10*3/uL (ref 3.50–10.80)

## 2016-12-26 LAB — TYPE AND SCREEN
AB Screen Gel: NEGATIVE
ABO Rh: A POS

## 2016-12-26 SURGERY — Surgical Case
Anesthesia: Regional | Site: Abdomen | Wound class: Clean Contaminated

## 2016-12-26 MED ORDER — CARBOPROST TROMETHAMINE 250 MCG/ML IM SOLN
INTRAMUSCULAR | Status: DC
Start: 2016-12-26 — End: 2016-12-27
  Filled 2016-12-26: qty 1

## 2016-12-26 MED ORDER — EPHEDRINE SULFATE 50 MG/ML IJ SOLN
INTRAMUSCULAR | Status: DC | PRN
Start: 2016-12-26 — End: 2016-12-26
  Administered 2016-12-26: 25 mg via SUBCUTANEOUS

## 2016-12-26 MED ORDER — OXYCODONE-ACETAMINOPHEN 5-325 MG PO TABS
1.0000 | ORAL_TABLET | Freq: Once | ORAL | Status: DC | PRN
Start: 2016-12-26 — End: 2016-12-26
  Filled 2016-12-26: qty 1

## 2016-12-26 MED ORDER — LACTATED RINGERS IV SOLN
INTRAVENOUS | Status: DC
Start: 2016-12-26 — End: 2016-12-29

## 2016-12-26 MED ORDER — OXYCODONE HCL 5 MG PO TABS
5.0000 mg | ORAL_TABLET | ORAL | Status: DC | PRN
Start: 2016-12-26 — End: 2016-12-29

## 2016-12-26 MED ORDER — MISOPROSTOL 200 MCG PO TABS
800.0000 ug | ORAL_TABLET | Freq: Once | ORAL | Status: DC | PRN
Start: 2016-12-26 — End: 2016-12-27

## 2016-12-26 MED ORDER — OXYTOCIN-SODIUM CHLORIDE 30-0.9 UT/500ML-% IV SOLN
INTRAVENOUS | Status: AC
Start: 2016-12-26 — End: ?
  Filled 2016-12-26: qty 500

## 2016-12-26 MED ORDER — ONDANSETRON HCL 4 MG/2ML IJ SOLN
4.0000 mg | Freq: Once | INTRAMUSCULAR | Status: AC | PRN
Start: 2016-12-26 — End: 2016-12-27
  Administered 2016-12-27: 4 mg via INTRAVENOUS

## 2016-12-26 MED ORDER — HYDROMORPHONE HCL 0.5 MG/0.5 ML IJ SOLN
0.5000 mg | INTRAMUSCULAR | Status: DC | PRN
Start: 2016-12-26 — End: 2016-12-27
  Filled 2016-12-26: qty 0.5

## 2016-12-26 MED ORDER — METHYLERGONOVINE MALEATE 0.2 MG/ML IJ SOLN
200.0000 ug | Freq: Once | INTRAMUSCULAR | Status: DC | PRN
Start: 2016-12-26 — End: 2016-12-27

## 2016-12-26 MED ORDER — PHENYLEPHRINE 100 MCG/ML IN NACL 0.9% IV SOSY
PREFILLED_SYRINGE | INTRAVENOUS | Status: DC | PRN
Start: 2016-12-26 — End: 2016-12-26
  Administered 2016-12-26 (×2): 100 ug via INTRAVENOUS

## 2016-12-26 MED ORDER — LACTATED RINGERS IV SOLN
INTRAVENOUS | Status: DC | PRN
Start: 2016-12-26 — End: 2016-12-26

## 2016-12-26 MED ORDER — OXYCODONE-ACETAMINOPHEN 5-325 MG PO TABS
1.0000 | ORAL_TABLET | ORAL | Status: DC | PRN
Start: 2016-12-26 — End: 2016-12-26

## 2016-12-26 MED ORDER — ONDANSETRON HCL 4 MG/2ML IJ SOLN
4.0000 mg | Freq: Four times a day (QID) | INTRAMUSCULAR | Status: DC | PRN
Start: 2016-12-26 — End: 2016-12-27

## 2016-12-26 MED ORDER — METHYLERGONOVINE MALEATE 0.2 MG/ML IJ SOLN
200.0000 ug | INTRAMUSCULAR | Status: DC | PRN
Start: 2016-12-26 — End: 2016-12-27

## 2016-12-26 MED ORDER — ONDANSETRON HCL 4 MG/2ML IJ SOLN
4.0000 mg | Freq: Once | INTRAMUSCULAR | Status: DC | PRN
Start: 2016-12-26 — End: 2016-12-29

## 2016-12-26 MED ORDER — HYDROMORPHONE HCL 0.5 MG/0.5 ML IJ SOLN
0.5000 mg | INTRAMUSCULAR | Status: DC | PRN
Start: 2016-12-26 — End: 2016-12-29

## 2016-12-26 MED ORDER — ONDANSETRON HCL 4 MG/2ML IJ SOLN
INTRAMUSCULAR | Status: AC
Start: 2016-12-26 — End: ?
  Filled 2016-12-26: qty 2

## 2016-12-26 MED ORDER — NALBUPHINE HCL 10 MG/ML IJ SOLN
5.0000 mg | INTRAMUSCULAR | Status: DC | PRN
Start: 2016-12-26 — End: 2016-12-27

## 2016-12-26 MED ORDER — DEXTROSE 5% IV BOLUS
INTRAVENOUS | Status: DC | PRN
Start: 2016-12-26 — End: 2016-12-26

## 2016-12-26 MED ORDER — LACTATED RINGERS IV SOLN
100.0000 mL/h | INTRAVENOUS | Status: DC
Start: 2016-12-26 — End: 2016-12-29

## 2016-12-26 MED ORDER — NALBUPHINE HCL 10 MG/ML IJ SOLN
5.0000 mg | INTRAMUSCULAR | Status: DC | PRN
Start: 2016-12-26 — End: 2016-12-29
  Administered 2016-12-26 – 2016-12-27 (×2): 5 mg via SUBCUTANEOUS
  Filled 2016-12-26: qty 1

## 2016-12-26 MED ORDER — NALBUPHINE HCL 10 MG/ML IJ SOLN
INTRAMUSCULAR | Status: AC
Start: 2016-12-26 — End: ?
  Filled 2016-12-26: qty 1

## 2016-12-26 MED ORDER — OXYTOCIN 10 UNIT/ML IJ SOLN
INTRAMUSCULAR | Status: DC | PRN
Start: 2016-12-26 — End: 2016-12-26
  Administered 2016-12-26: 40 [IU] via INTRAVENOUS

## 2016-12-26 MED ORDER — FAMOTIDINE 10 MG/ML IV SOLN (WRAP)
INTRAVENOUS | Status: DC | PRN
Start: 2016-12-26 — End: 2016-12-26
  Administered 2016-12-26: 20 mg via INTRAVENOUS

## 2016-12-26 MED ORDER — SODIUM CHLORIDE 0.9 % IJ SOLN
3.0000 mL | Freq: Three times a day (TID) | INTRAMUSCULAR | Status: DC
Start: 2016-12-28 — End: 2016-12-29

## 2016-12-26 MED ORDER — IBUPROFEN 600 MG PO TABS
ORAL_TABLET | ORAL | Status: AC
Start: 2016-12-26 — End: ?
  Filled 2016-12-26: qty 1

## 2016-12-26 MED ORDER — IBUPROFEN 600 MG PO TABS
600.0000 mg | ORAL_TABLET | Freq: Four times a day (QID) | ORAL | Status: DC
Start: 2016-12-27 — End: 2016-12-29
  Administered 2016-12-27 – 2016-12-29 (×9): 600 mg via ORAL
  Filled 2016-12-26 (×9): qty 1

## 2016-12-26 MED ORDER — FENTANYL CITRATE (PF) 50 MCG/ML IJ SOLN (WRAP)
INTRAMUSCULAR | Status: DC | PRN
Start: 2016-12-26 — End: 2016-12-26
  Administered 2016-12-26: 21:00:00 2.2 mL via INTRASPINAL

## 2016-12-26 MED ORDER — FENTANYL CITRATE (PF) 50 MCG/ML IJ SOLN (WRAP)
25.0000 ug | INTRAMUSCULAR | Status: DC | PRN
Start: 2016-12-26 — End: 2016-12-27
  Filled 2016-12-26: qty 2

## 2016-12-26 MED ORDER — CEFAZOLIN SODIUM-DEXTROSE 2-3 GM-% IV SOLR
2.0000 g | Freq: Once | INTRAVENOUS | Status: AC
Start: 2016-12-26 — End: 2016-12-26
  Administered 2016-12-26: 2 g via INTRAVENOUS
  Filled 2016-12-26: qty 50

## 2016-12-26 MED ORDER — EPHEDRINE SULFATE 50 MG/ML IJ/IV SOLN (WRAP)
Status: AC
Start: 2016-12-26 — End: ?
  Filled 2016-12-26: qty 1

## 2016-12-26 MED ORDER — ONDANSETRON 4 MG PO TBDP
4.0000 mg | ORAL_TABLET | Freq: Four times a day (QID) | ORAL | Status: DC | PRN
Start: 2016-12-26 — End: 2016-12-27

## 2016-12-26 MED ORDER — DEXTROSE IN LACTATED RINGERS 5 % IV SOLN
Freq: Once | INTRAVENOUS | Status: AC
Start: 2016-12-26 — End: 2016-12-26

## 2016-12-26 MED ORDER — PHENYLEPHRINE 100 MCG/ML IN NACL 0.9% IV SOSY
PREFILLED_SYRINGE | INTRAVENOUS | Status: AC
Start: 2016-12-26 — End: ?
  Filled 2016-12-26: qty 25

## 2016-12-26 MED ORDER — OXYTOCIN-SODIUM CHLORIDE 30-0.9 UT/500ML-% IV SOLN
7.5000 [IU]/h | INTRAVENOUS | Status: AC
Start: 2016-12-26 — End: 2016-12-27
  Administered 2016-12-26: 22:00:00 7.5 [IU]/h via INTRAVENOUS

## 2016-12-26 MED ORDER — METOCLOPRAMIDE HCL 5 MG/ML IJ SOLN
INTRAMUSCULAR | Status: DC | PRN
Start: 2016-12-26 — End: 2016-12-26
  Administered 2016-12-26: 10 mg via INTRAVENOUS

## 2016-12-26 MED ORDER — METHYLERGONOVINE MALEATE 0.2 MG/ML IJ SOLN
INTRAMUSCULAR | Status: DC
Start: 2016-12-26 — End: 2016-12-27
  Filled 2016-12-26: qty 1

## 2016-12-26 MED ORDER — MISOPROSTOL 200 MCG PO TABS
ORAL_TABLET | ORAL | Status: DC
Start: 2016-12-26 — End: 2016-12-27
  Filled 2016-12-26: qty 5

## 2016-12-26 MED ORDER — ONDANSETRON HCL 4 MG/2ML IJ SOLN
INTRAMUSCULAR | Status: DC | PRN
Start: 2016-12-26 — End: 2016-12-26
  Administered 2016-12-26: 4 mg via INTRAVENOUS

## 2016-12-26 MED ORDER — OXYTOCIN 10 UNIT/ML IJ SOLN
INTRAMUSCULAR | Status: AC
Start: 2016-12-26 — End: ?
  Filled 2016-12-26: qty 3

## 2016-12-26 MED ORDER — FAMOTIDINE 20 MG/2ML IV SOLN
INTRAVENOUS | Status: AC
Start: 2016-12-26 — End: ?
  Filled 2016-12-26: qty 2

## 2016-12-26 MED ORDER — LACTATED RINGERS IV BOLUS
1000.0000 mL | Freq: Once | INTRAVENOUS | Status: AC
Start: 2016-12-26 — End: 2016-12-26
  Administered 2016-12-26: 1000 mL via INTRAVENOUS

## 2016-12-26 MED ORDER — SOD CITRATE-CITRIC ACID 500-334 MG/5ML PO SOLN
30.0000 mL | Freq: Once | ORAL | Status: DC | PRN
Start: 2016-12-26 — End: 2016-12-27

## 2016-12-26 MED ORDER — DEXTROSE IN LACTATED RINGERS 5 % IV SOLN
INTRAVENOUS | Status: DC
Start: 2016-12-26 — End: 2016-12-29

## 2016-12-26 MED ORDER — OXYTOCIN 10 UNIT/ML IJ SOLN
INTRAMUSCULAR | Status: AC
Start: 2016-12-26 — End: ?
  Filled 2016-12-26: qty 1

## 2016-12-26 MED ORDER — MEPERIDINE HCL 25 MG/ML IJ SOLN
6.2500 mg | INTRAMUSCULAR | Status: DC | PRN
Start: 2016-12-26 — End: 2016-12-27
  Filled 2016-12-26: qty 1

## 2016-12-26 MED ORDER — NALOXONE HCL 0.4 MG/ML IJ SOLN (WRAP)
0.1000 mg | INTRAMUSCULAR | Status: DC | PRN
Start: 2016-12-26 — End: 2016-12-29

## 2016-12-26 MED ORDER — MORPHINE SULFATE (PF) 1 MG/ML SYRINGE 1 ML (NICU)
INTRAMUSCULAR | Status: AC
Start: 2016-12-26 — End: ?
  Filled 2016-12-26: qty 1

## 2016-12-26 MED ORDER — METOCLOPRAMIDE HCL 5 MG/ML IJ SOLN
INTRAMUSCULAR | Status: AC
Start: 2016-12-26 — End: ?
  Filled 2016-12-26: qty 2

## 2016-12-26 MED ORDER — LACTATED RINGERS IV SOLN
125.0000 mL/h | INTRAVENOUS | Status: AC
Start: 2016-12-27 — End: 2016-12-28

## 2016-12-26 MED ORDER — IBUPROFEN 600 MG PO TABS
600.0000 mg | ORAL_TABLET | Freq: Four times a day (QID) | ORAL | Status: DC | PRN
Start: 2016-12-26 — End: 2016-12-29
  Administered 2016-12-26: 600 mg via ORAL

## 2016-12-26 SURGICAL SUPPLY — 22 items
CLEANER ELECTROSURGICAL TIP PENCIL (Procedure Accessories) ×1
CLEANER ELECTROSURGICAL TIP PENCIL HANDSWITCH LECTROBRASIVE (Procedure Accessories) ×1 IMPLANT
CLEANER ESURG TIP PNCL LCTRBRS STRL (Procedure Accessories) ×1
DRESSING ISLAND TELFA 4X10 (Dressing) ×2 IMPLANT
DRESSING TELFA 3X8IN STERILE (Dressing) ×2 IMPLANT
GAUZE SPONGE CURITY 12PLY 4X8 (Dressing) ×2 IMPLANT
GLOVE SURG BIOGEL INDIC SZ 7.5 (Glove) ×2 IMPLANT
GLOVE SURG BIOGEL SKNSNS SZ7.5 (Glove) ×2 IMPLANT
MASTISOL VIAL 2/3CC STRL (Skin Closure) ×2 IMPLANT
PAD ELECTROSRG GRND REM W CRD (Procedure Accessories) ×2 IMPLANT
PEN SRGMRK 6IN LF STRL RLR LG RSRV REG (Positioning Supplies) ×1
PEN SURGICAL MARKING MEDLINE SKIN RULER (Positioning Supplies) ×1
PEN SURGICAL MARKING SKIN RULER LARGE RESERVOIR REGULAR TIP LABEL (Positioning Supplies) ×1 IMPLANT
PENCIL ELECTRO PUSH BUTTON (Cautery) ×2 IMPLANT
SLEEVE CMPR MED KN LGTH KDL SCD 21- IN (Sleeve) ×2
SLEEVE COMPRESSION MEDIUM KNEE LENGTH KENDALL SEQUENTIAL OD21- IN (Sleeve) ×1 IMPLANT
STRIP SKIN CLOSURE L4 IN X W1/2 IN (Dressing) ×1
STRIP SKIN CLOSURE L4 IN X W1/2 IN REINFORCE STERI-STRIP POLYESTER (Dressing) ×1 IMPLANT
STRIP SKNCLS PLSTR STRSTRP 4X.5IN LF (Dressing) ×1
SUTURE ABS 2-0 CT1 VCL 27IN BRD COAT UD (Suture) ×4
SUTURE COATED VICRYL 2-0 CT-1 L27 IN (Suture) ×4
SUTURE COATED VICRYL 2-0 CT-1 L27 IN BRAID COATED UNDYED ABSORBABLE (Suture) ×4 IMPLANT

## 2016-12-26 NOTE — Progress Notes (Signed)
BS increased to 176 after D5LR bolus of 500 mls and now dropped to 75,Dr Tchabo informed and pt.is now receiving D5LR  contineously at 125 mls /hr.

## 2016-12-26 NOTE — Op Note (Signed)
Procedure Date: 12/26/2016     Patient Type: I     SURGEON: Velta Addison MD  ASSISTANT:  Or  SA     ASSISTANT:    OR SA.     PREOPERATIVE DIAGNOSES:  Intrauterine pregnancy 38 weeks, type 1 diabetes on insulin pump, previous  cesarean section x3, left sciatic pain      POSTOPERATIVE DIAGNOSIS:  SAME  ,PLUS OMENTAL ABDOMINAL ADHESIONS      TITLE OF PROCEDURE:  REPEAT C/SECTION VACUUM ASSISTED DELIVEY, LYSIS OF ADHESIONS     INDICATIONS FOR PROCEDURE:  Ms. Hayman is a 31 year old female.  She is a gravida 6, para 3, AB 2,   38-week pregnancy by date, term by sonographic exam.  Prenatal care  complicated by type 1 diabetE, well controlled on insulin pump. she  was seen at the [facility] triage clinic FOR incapacitating left sciatic pain and she is admitted  for repeat cesarean section.     DESCRIPTION OF PROCEDURE:  She was taken the operating room, under spinal anesthesia, the Foley  catheter in the bladder, she was prepped and draped in supine position.   Using a sharp knife, a Pfannenstiel incision through the old scar was made  and carried down to the subcutaneous tissue to the fascia.  The fascia was  cut then dissected bilaterally then proximally and distally.  The rectus  and the pyramidalis muscles were separated in the midline.  The parietal  and the peritoneal cavity were entered without incident.  The visceral  peritoneum was developed .amniotic fluid  window noted and the uterine cavity entered  with Metzenbaum scissors and the baby's head was brought to the incision.   Vacuum was applied to the flexion point and the baby's head was delivered.   Baby was crying spontaneously.  The baby's mouth was bulb suctioned.  The  baby delivered in toto.  Delayed cord clamping for 1 minute.  The cord  doubly clamped, cut, and the baby passed to the pediatrician in attendance.   A live female infant, Apgars 8 and 9,  weigth 3670 grams.  Cord  had 3 vessels.  Placenta and membranes manually removed intact.  The uterus  was  exteriorized.  The uterine cavity sponged clean and  uterine  incision held with 2 ring forceps and the uterus closed in 1 layer using  2-0 Vicryl runing interlocking.the omentum adhesed to the ant abdominal wall was lysed.  Hemostasis was adequate.  The peritoneal cavity  was sponged clean.  There was no pathology on the posterior wall of the  uterus.  Both ovaries and fallopian tubes were normal.  The uterus was  repositioned in the abdominal cavity.  The rectus and the pyramidalis  muscles were reapproximated in the midline with 3-0 Vicryl in  figure-of-eight.  Hemostasis was adequate.  The fascia was closed with 2-0  Vicryl running suture, the subcutaneous tissue with 3-0 plain running  suture, and the skin with 4-0 Monocryl subcuticular stitch.  Estimated  blood loss was 500 mL.  There were no complications.  The patient tolerated  the procedure well and was taken to the recovery room in stable condition,  the Foley catheter draining clear urine.  Instruments, sharp and sponge  counts were correct x2.  There were no complications during the surgery.           D:  12/26/2016 21:37 PM by Dr. Velta Addison, MD [phone])  T:  12/26/2016 22:13 PM by NTS      (  Conf: [id number]) (Doc ID: [id number])

## 2016-12-26 NOTE — Anesthesia Preprocedure Evaluation (Addendum)
Anesthesia Evaluation    AIRWAY    Mallampati: II    TM distance: >3 FB  Neck ROM: full  Mouth Opening:full   CARDIOVASCULAR           DENTAL         PULMONARY         OTHER FINDINGS    Exercise-induced asthma  LBP (wraps around abdomen, no LE sx)  Posterior/high placenta  Type I DM (insulin pump)  NPO after 12pm (granola bar)      Labs reviewed. Hct 35.6, plt 237        Relevant Problems   No active problems are marked relevant to this note.               Anesthesia Plan    ASA 2     spinal                     Detailed anesthesia plan: spinal      Post Op: other  Post op pain management: intrathecal    informed consent obtained    Plan discussed with attending.      pertinent labs reviewed             Signed by: Orpah Greek 12/26/16 7:53 PM

## 2016-12-26 NOTE — Progress Notes (Signed)
Called re glucose of 54.  Pt seen asymptomatic   Plan insulin pump off  d10 lr bolus.  Pt blood glucose up to 170.   d10lr off. Insulin pump  Restarted.  fht cat 1 moderate variability .  accel +

## 2016-12-26 NOTE — Progress Notes (Signed)
B.S sugar=55,Dr.Chabo informed and pt.is being  Given of D5LR per DR Tchabo's orders.

## 2016-12-26 NOTE — Anesthesia Procedure Notes (Signed)
Spinal      Patient location during procedure: OR  Reason for block: Primary Anesthesia In the OR    Block at Surgeon's request: Yes          Staffing  Performed: anesthesiologist       Pre-procedure Checklist   Completed: patient identified, surgical consent, pre-op evaluation, timeout performed, risks and benefits discussed, monitors and equipment checked, anesthesia consent given and correct site      Spinal  Patient monitoring: NIBP and pulse oximetry    Premedication: No    Patient position: sitting    Sterile Technique: povidone-iodine 7.5% surgical scrub, patient draped, mask used and wearing gloves  Skin Local: lidocaine 1%    Successful attempt  Interspace: L4-5    Approach: midline  Number of attempts: 1      Needle Placement  Needle type: Whitacre tip   Needle gauge: 25              Paresthesia Pain: no    Catheter Placement   CSF Return: Yes  Blood Return: No              Assessment   Sensory level: T4  Block Outcome: patient tolerated procedure well, no complications and successful block

## 2016-12-26 NOTE — Transfer of Care (Signed)
Anesthesia Transfer of Care Note    Patient: Allison Salazar    Procedures performed: Procedure(s):  CESAREAN SECTION    Anesthesia type: Spinal    Patient location:PACU    Last vitals:   Vitals:    12/26/16 2140   BP: 120/66   Pulse: 82   Resp: 16   Temp: 36.4 C (97.5 F)   SpO2: 100%       Post pain: Patient not complaining of pain, continue current therapy      Mental Status:awake and alert     Respiratory Function: tolerating room air    Cardiovascular: stable    Nausea/Vomiting: patient not complaining of nausea or vomiting    Hydration Status: adequate    Post assessment: no apparent anesthetic complications and no reportable events    Signed by: Drusilla Kanner  12/26/16 9:42 PM

## 2016-12-26 NOTE — Progress Notes (Signed)
Blood Sugar 55. Per patient.

## 2016-12-26 NOTE — Progress Notes (Signed)
Pt presents to triage with c/o left sided back pain that radiates to the front.  Pt reports this has been going on since last night but has gotten worse starting at 1300.  Pt denies VB, LOF, and reports +FM.  EFM placed will monitor and notify MD.

## 2016-12-26 NOTE — Op Note (Signed)
FULL OPERATIVE NOTE    Date Time: 12/26/16 9:23 PM  [facility]  Attending Physician: Loney Loh, MD      Date of Operation:   12/26/2016    Providers Performing:   Surgeon(s):  Velta Addison, MD    * No surgical staff found *    Operative Procedure:   Procedure(s):  CESAREAN SECTION    Preoperative Diagnosis:   Pre-Op Diagnosis Codes:     * Maternal care for low transverse scar from previous cesarean delivery [O34.211]     * Type 1 diabetes mellitus with complication [E10.8]     * [redacted] weeks gestation of pregnancy [Z3A.38]    Postoperative Diagnosis:   Post-Op Diagnosis Codes:     * Maternal care for low transverse scar from previous cesarean delivery [O34.211]     * Type 1 diabetes mellitus with complication [E10.8]     * [redacted] weeks gestation of pregnancy [Z3A.38]    Indications:   iup 38w/   Previous c/s x3/   diabetes type 1  Left psyciatic pain  Operative Notes:   dictated    Estimated Blood Loss:   500 mL    Implants:   * No implants in log *    Drains:   Drains:  no    Specimens:     none  Complications:   none    Signed by: Velta Addison, MD

## 2016-12-26 NOTE — H&P (Signed)
ADMISSION HISTORY AND PHYSICAL EXAM    Date Time: 12/26/16 5:31 PM  Patient Name: Allison Salazar  Attending Physician: Loney Loh, MD    Assessment:   [phone]. 38 w by date ,term by size and exam. Type I DM on insulin pump well controled.previous c/s x3..was scheduled  For repeat c/s in am/ seen at triage for worsening pain since yesterday starting from  Left  Flank, radiating to pelvis and left leg. No hx of kidney stone. No hematuria, no fever or chills.   Most likely pscyatic pain.     Repeat c/s.    History of Present Illness:   Allison Salazar is a 31 y.o. female who presents to the hospital at 38w type 1 DM ON INSULIN PUM. previous c/s x3.   INCAPACITATING PSCYATIC PAIN.    Past Medical History:     Past Medical History:   Diagnosis Date   . Anemia    . Asthma, exercise induced 2015    NO inhaler use 43yrs- exercise induced   . Diabetes mellitus 1989    diagnosed at age 45, uses insulin pump   . Eczema 2015   . Headache(784.0) 2015    occ migraine   . History of blood transfusion 2012    long labor, atony   . Impaired vision     contacts   . PONV (postoperative nausea and vomiting)     N/V   . Postpartum depression 2012    zoloft   . Pregnant state, incidental    . Seasonal allergies        Past Surgical History:     Past Surgical History:   Procedure Laterality Date   . APPENDECTOMY  2005    scope   . CESAREAN SECTION  2012 , 2014, 2015    FTP   . CESAREAN SECTION N/A 06/30/2014    Procedure: CESAREAN SECTION;  Surgeon: Loistine Chance, MD;  Location: [facility] LABOR OR;  Service: Obstetrics;  Laterality: N/A;   . MOLERS  2007   . WISDOM TOOTH EXTRACTION  2004,        Family History:     Family History   Problem Relation Age of Onset   . Heart attack Father 17   . Heart disease Father    . Hypertension Father    . Arthritis Father    . Coronary artery disease Father    . Hypertension Paternal Grandmother    . Hyperlipidemia Paternal Grandmother    . Kidney disease Paternal Grandmother    . Cancer Mother       CERVICAL   . Hypertension Paternal Grandfather    . Kidney disease Paternal Grandfather    . Heart attack Paternal Grandfather    . Heart disease Paternal Grandfather    . Cervical cancer Sister    . Cancer Sister      CERVICAL AND SKIN   . Melanoma Sister    . Kidney disease Paternal Uncle      KIDNEY TRANSPLANT   . Osteoporosis Maternal Grandmother    . Asthma Neg Hx    . Diabetes Neg Hx    . Stomach cancer Neg Hx    . Stroke Neg Hx        Social History:     Social History     Social History   . Marital status: Married     Spouse name: N/A   . Number of children: N/A   . Years of education:  N/A     Social History Main Topics   . Smoking status: Never Smoker   . Smokeless tobacco: Never Used   . Alcohol use No   . Drug use: No   . Sexual activity: Yes     Partners: Male     Birth control/ protection: Condom     Other Topics Concern   . 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        Allergies:     Allergies   Allergen Reactions   . Latex Dermatitis       Medications:     Facility-Administered Medications Prior to Admission   Medication   . albuterol (PROVENTIL) nebulizer solution 2.5 mg     Prescriptions Prior to Admission   Medication Sig   . aspirin 81 MG tablet Take 81 mg by mouth daily.   . insulin lispro (HUMALOG) 100 UNIT/ML injection Use 100 units a day for Insulin pump   . Insulin Syringe-Needle U-100 (BD INSULIN SYRINGE ULTRAFINE) 31G X 5/16" 0.5 ML Misc Use 1 to inject insulin as needed   . Prenat-FeFum-Fered-FA-DHA w/oA (PRENA1 PEARL) 30-1.4-200 MG Cap CR TK 1 C PO QD   . albuterol (PROVENTIL HFA;VENTOLIN HFA) 108 (90 BASE) MCG/ACT inhaler Inhale 2 puffs into the lungs every 6 (six) hours as needed for Wheezing.   Marland Kitchen amoxicillin (AMOXIL) 875 MG tablet Take 1 tablet (875 mg total) by mouth 2 (two) times daily.   . benzonatate (TESSALON) 100 MG capsule Take 1 capsule (100 mg total) by mouth 3 (three) times daily as needed for Cough.   Marland Kitchen glucagon 1 MG  injection Use as needed for hypoglycemia   . glucose blood test strip One touch ultra test strips. Use 1 strip 8 times a day   . Ketone Blood Test Strip Use 1 as needed to check for urine ketones       Review of Systems:   A comprehensive review of systems was: NEGATIVE  EXCEPT AS  ABOVE    Physical Exam:     Vitals:    12/26/16 1700   BP: 111/73   Pulse: 82   Resp:    SpO2:        Intake and Output Summary (Last 24 hours) at Date Time  No intake or output data in the 24 hours ending 12/26/16 1731    P/E  ALERT ACTIVE. ORIENTED X3 NO DISTRESS. VS STABLE , AFEBRILE   NECK NO MASS.   LUNGS CLEAR, NO RALE. HEART NSR NO MURMUR.  ABDOMEN SOFT ,BS + NOTENDERNESS, UTERUS  GRAVID ,TERM BABY CEPH. ACTIVE. FHT CAT1 MODERATE VARIABILITY . ACCEL + EFW 3600G.   GENITALIA  DEFERED.  EXTREMITIES NO PAIN NO EDEMA.       Labs:     Results     ** No results found for the last 24 hours. **              Rads:   Radiological Procedure reviewed.    Signed by: Velta Addison

## 2016-12-27 ENCOUNTER — Encounter (HOSPITAL_BASED_OUTPATIENT_CLINIC_OR_DEPARTMENT_OTHER): Payer: Self-pay | Admitting: Obstetrics & Gynecology

## 2016-12-27 LAB — CBC AND DIFFERENTIAL
Absolute NRBC: 0 10*3/uL
Basophils Absolute Automated: 0.03 10*3/uL (ref 0.00–0.20)
Basophils Automated: 0.2 %
Eosinophils Absolute Automated: 0 10*3/uL (ref 0.00–0.70)
Eosinophils Automated: 0 %
Hematocrit: 32.5 % — ABNORMAL LOW (ref 37.0–47.0)
Hgb: 10.9 g/dL — ABNORMAL LOW (ref 12.0–16.0)
Immature Granulocytes Absolute: 0.07 10*3/uL — ABNORMAL HIGH
Immature Granulocytes: 0.6 %
Lymphocytes Absolute Automated: 1.38 10*3/uL (ref 0.50–4.40)
Lymphocytes Automated: 11.3 %
MCH: 29.6 pg (ref 28.0–32.0)
MCHC: 33.5 g/dL (ref 32.0–36.0)
MCV: 88.3 fL (ref 80.0–100.0)
MPV: 9.8 fL (ref 9.4–12.3)
Monocytes Absolute Automated: 0.53 10*3/uL (ref 0.00–1.20)
Monocytes: 4.4 %
Neutrophils Absolute: 10.16 10*3/uL — ABNORMAL HIGH (ref 1.80–8.10)
Neutrophils: 83.5 %
Nucleated RBC: 0 /100 WBC (ref 0.0–1.0)
Platelets: 185 10*3/uL (ref 140–400)
RBC: 3.68 10*6/uL — ABNORMAL LOW (ref 4.20–5.40)
RDW: 13 % (ref 12–15)
WBC: 12.17 10*3/uL — ABNORMAL HIGH (ref 3.50–10.80)

## 2016-12-27 LAB — GROUP B STREP TRANSCRIBED

## 2016-12-27 LAB — GLUCOSE WHOLE BLOOD - POCT: Whole Blood Glucose POCT: 125 mg/dL — ABNORMAL HIGH (ref 70–100)

## 2016-12-27 MED ORDER — MEASLES, MUMPS & RUBELLA VAC SC INJ
0.5000 mL | INJECTION | SUBCUTANEOUS | Status: DC | PRN
Start: 2016-12-27 — End: 2016-12-29

## 2016-12-27 MED ORDER — MISOPROSTOL 200 MCG PO TABS
800.0000 ug | ORAL_TABLET | Freq: Once | ORAL | Status: DC | PRN
Start: 2016-12-27 — End: 2016-12-29

## 2016-12-27 MED ORDER — METHYLERGONOVINE MALEATE 0.2 MG/ML IJ SOLN
200.0000 ug | Freq: Once | INTRAMUSCULAR | Status: DC | PRN
Start: 2016-12-27 — End: 2016-12-29

## 2016-12-27 MED ORDER — ACETAMINOPHEN 325 MG PO TABS
650.0000 mg | ORAL_TABLET | Freq: Four times a day (QID) | ORAL | Status: DC | PRN
Start: 2016-12-27 — End: 2016-12-29

## 2016-12-27 MED ORDER — DOCUSATE SODIUM 100 MG PO CAPS
200.0000 mg | ORAL_CAPSULE | Freq: Two times a day (BID) | ORAL | Status: DC | PRN
Start: 2016-12-27 — End: 2016-12-29
  Administered 2016-12-27 – 2016-12-28 (×3): 200 mg via ORAL
  Filled 2016-12-27 (×3): qty 2

## 2016-12-27 MED ORDER — ONDANSETRON 4 MG PO TBDP
4.0000 mg | ORAL_TABLET | Freq: Four times a day (QID) | ORAL | Status: DC | PRN
Start: 2016-12-27 — End: 2016-12-29

## 2016-12-27 MED ORDER — METHYLERGONOVINE MALEATE 0.2 MG/ML IJ SOLN
200.0000 ug | INTRAMUSCULAR | Status: DC | PRN
Start: 2016-12-27 — End: 2016-12-29

## 2016-12-27 MED ORDER — PRENATAL AD PO TABS
1.0000 | ORAL_TABLET | Freq: Every day | ORAL | Status: DC
Start: 2016-12-27 — End: 2016-12-29

## 2016-12-27 MED ORDER — BISACODYL 10 MG RE SUPP
10.0000 mg | Freq: Every day | RECTAL | Status: DC | PRN
Start: 2016-12-27 — End: 2016-12-29

## 2016-12-27 MED ORDER — ONDANSETRON HCL 4 MG/2ML IJ SOLN
INTRAMUSCULAR | Status: AC
Start: 2016-12-27 — End: ?
  Filled 2016-12-27: qty 2

## 2016-12-27 MED ORDER — LACTATED RINGERS IV SOLN
INTRAVENOUS | Status: DC
Start: 2016-12-27 — End: 2016-12-29

## 2016-12-27 MED ORDER — SIMETHICONE 80 MG PO CHEW
160.0000 mg | CHEWABLE_TABLET | Freq: Three times a day (TID) | ORAL | Status: DC | PRN
Start: 2016-12-27 — End: 2016-12-29

## 2016-12-27 MED ORDER — ACETAMINOPHEN 650 MG RE SUPP
650.0000 mg | Freq: Four times a day (QID) | RECTAL | Status: DC | PRN
Start: 2016-12-27 — End: 2016-12-29

## 2016-12-27 MED ORDER — ONDANSETRON HCL 4 MG/2ML IJ SOLN
4.0000 mg | Freq: Four times a day (QID) | INTRAMUSCULAR | Status: DC | PRN
Start: 2016-12-27 — End: 2016-12-29

## 2016-12-27 MED ORDER — TETANUS-DIPHTH-ACELL PERTUSSIS 5-2.5-18.5 LF-MCG/0.5 IM SUSP
0.5000 mL | INTRAMUSCULAR | Status: DC | PRN
Start: 2016-12-27 — End: 2016-12-29

## 2016-12-27 MED ORDER — LANOLIN EX OINT
TOPICAL_OINTMENT | CUTANEOUS | Status: DC | PRN
Start: 2016-12-27 — End: 2016-12-29
  Filled 2016-12-27: qty 7

## 2016-12-27 NOTE — Plan of Care (Addendum)
Patient is stable and without questions or concerns at this time. Pain has been well controlled and managed with Ibuprofen. Mom is breastfeeding baby on demand and Q3H for blood sugars. Elpidio Galea is appropriate it is rubra and scant.  Output is WNL. She is eating a regular diet and is tolerating it well. Mom agrees to notify nurse of any changes in her condition. POC reviewed this shift and patient verbalizes understanding and continues to progress. Baby safety teaching was done with mom and dad and they verbalized an understanding. Medication side effects were also reviewed with mom and she verbalized an understanding. Hourly rounding will continue to be done on baby.Will continue to monitor.

## 2016-12-27 NOTE — Progress Notes (Signed)
POD #1  Ambulatory  Tolerating diet  +flatus  BP 124/74   Pulse 71   Temp 98.2 F (36.8 C) (Oral)   Resp 18   Ht 1.626 m (5\' 4" )   Wt 78 kg (172 lb)   LMP 04/04/2016 (Exact Date) Comment: c/w 11w 6d [place]  SpO2 99%   Breastfeeding? Yes   BMI 29.52 kg/m   abd soft no tender         Incision healing well  Lochia  minimal  Ext neg       Recent Labs  Lab 12/27/16  0526   WBC 12.17*   Hgb 10.9*   Hematocrit 32.5*   Platelets 185       Routine care

## 2016-12-27 NOTE — Plan of Care (Signed)
Patient is stable and without questions or concerns at this time.  POC reviewed this shift and patient verbalizes understanding and continues to progress.Pt   Is Type 1 DM , pt states , she managed her Dm with insulin pump    Patient pain level within patient stated goal: Yes  Patient breastfeeding infant: Yes  Patient pumping: Yes  Patient lochia appropriate: Yes,   Foley :  Yes  Abdominal incision clean, dry and well approximated:  Yes  Nutrition adequate for discharge: Yes  Patient reports constipation: No  Patient agrees to notify RN of any changes in her condition/status: Yes

## 2016-12-27 NOTE — Progress Notes (Signed)
OB PAIN MANAGEMENT    POD #1 S/P CESAREAN DELIVERY    Postop Analgesia:  spinal morphine    Side Effects:  Abdominal / Pelvic Pain:  Minimal (VAS 1-3)  Mental Status:  Alert  Nausea/Vomiting: None  Pruritus:none    Impression:  Uncomplicated recovery without anesthesia complications  Tolerates p.o.    Plan:  P.O. Analgesics

## 2016-12-27 NOTE — Anesthesia Postprocedure Evaluation (Signed)
Anesthesia Post Evaluation    Patient: Allison Salazar    Procedure(s):  CESAREAN SECTION    Anesthesia type: Spinal    Last Vitals:   Vitals:    12/27/16 0808   BP: 124/74   Pulse: 71   Resp: 18   Temp: 36.8 C (98.2 F)   SpO2: 99%       Patient Location: Med Surgical Floor (612)      Post Pain: Patient not complaining of pain, continue current therapy    Mental Status: awake and alert    Respiratory Function: tolerating room air    Cardiovascular: stable    Nausea/Vomiting: patient not complaining of nausea or vomiting    Hydration Status: adequate    Post Assessment: no apparent anesthetic complications and no reportable events          Anesthesia Qualified Clinical Data Registry 2018    PACU Reintubation  Did the Patient have general anesthesia with intubation: No        PONV Adult  Is the patient aged 32 or older: Yes  Did the patient receive recieve a general anesthestic: No          PONV Pediatric  Is the patient aged 7-17? No            PACU Transfer Checklist Protocol  Was the patient transferred to the PACU at the conclusion of surgery? Yes  Was a checklist or transfer protocol used? Yes    ICU Transfer Checklist Protocol  Was the patient transferred to the ICU at the conclusion of surgery? No      Post-op Pain Assessment Prior to Anesthesia Care End  Age >=18 and assessed for pain in PACU: Yes  Pacu pain score <7/10: Yes      Perioperative Mortality  Perioperative mortality prior to Anesthesia end time: No    Perioperative Cardiac Arrest  Did the patient have an unanticipated intraoperative cardiac arrest between anesthesia start time and anesthesia end time? No    Unplanned Admission to ICU  Did the patient have an unplanned admission to the ICU (not initially anticipated at anesthesia start time)? No      Signed by: Enzo Bi, 12/27/2016 11:11 AM

## 2016-12-27 NOTE — Plan of Care (Signed)
Patient VSS. POC reviewed this shift and patient verbalizes understanding. Pain being managed effectively with pain medication with side effects of medications reviewed. Breastfeeding and pumping at this time. All questions/concerns answered. Will continue to monitor for safety and comfort.

## 2016-12-27 NOTE — Plan of Care (Signed)
Initial lactation consult with this experienced breastfeeding mother. Patient declined questions or assistance with breastfeeding at this time.  Encouraged to do skin to skin and breast feed with all cues (at least 8-12 times per 24 hours).    Contact number on white board. Patient to call for questions or assistance as needed.    Follow up PRN.

## 2016-12-27 NOTE — Plan of Care (Signed)
"  Patient manages and supplies her own insulin" per Dr. Felipa Furnace. He is aware and okay with this.

## 2016-12-28 MED ORDER — SERTRALINE HCL 50 MG PO TABS
50.0000 mg | ORAL_TABLET | Freq: Every day | ORAL | Status: DC
Start: 2016-12-28 — End: 2016-12-29
  Administered 2016-12-28 – 2016-12-29 (×2): 50 mg via ORAL
  Filled 2016-12-28 (×3): qty 1

## 2016-12-28 NOTE — Progress Notes (Signed)
POD #1  Ambulatory  Tolerating diet  +flatus   BP 105/54   Pulse 74   Temp 98.1 F (36.7 C) (Oral)   Resp 18   Ht 1.626 m (5\' 4" )   Wt 78 kg (172 lb)   LMP 04/04/2016 (Exact Date) Comment: c/w 11w 6d [place]  SpO2 97%   Breastfeeding? Yes   BMI 29.52 kg/m \  abd incision healing well  Lochia minimal  Ext neg    Class 1 DM      BS managed by pump    Routine care  Anticipate d/c tomorrow

## 2016-12-28 NOTE — UM Notes (Signed)
FACILITY TAX ID : [id number]  NPI : [id number]    ADMIT DATE: 12/26/2016                   TODAY'S DATE: 12/28/16   TYPE OF REVIEW:  INITIAL                       STATUS :  INPT    UNIT: [facility]     Admit to Inpatient (Order [id number])   Admission   Date: 12/26/2016 Department: [facility] Released By/Authorizing: Velta Addison, MD (auto-released)   Order Information     Order Date/Time Release Date/Time Start Date/Time End Date/Time   12/26/16 09:20 PM 12/26/16 09:20 PM 12/26/16 09:19 PM 12/26/16 09:19 PM       Maternal care for low transverse scar from previous cesarean delivery [O34.211]    Care date : 1   CLINICAL PRESENTATION:  Age 31 admitted for C section : Maternal care for low transverse scar from previous cesarean delivery   [redacted] weeks gestation.   31 [id number]. 38 w by date ,term by size and exam, repeat C section in AM seen in triage for worsening left flank pain radiating to pelvis. Type 1 DM with diabetic pump , She is a gravida 6, para 3, AB 2,   38-week pregnancy by date, term by sonographic exam per Md notes.      PREOPERATIVE DIAGNOSES:  Intrauterine pregnancy 38 weeks, type 1 diabetes on insulin pump, previous  cesarean section x3, left sciatic pain      POSTOPERATIVE DIAGNOSIS:  SAME  ,PLUS OMENTAL ABDOMINAL ADHESIONS    PMH:  has a past medical history of Anemia; Asthma, exercise induced (2015); Diabetes mellitus (1989); Eczema (2015); Headache(784.0) (2015); History of blood transfusion (2012); Impaired vision; PONV (postoperative nausea and vomiting); Postpartum depression (2012); Pregnant state, incidental; and Seasonal allergies.       Past surgical history :  has a past surgical history that includes Appendectomy (2005); Wisdom tooth extraction (2004, ); Cesarean section (2012 , 2014, 2015); CESAREAN SECTION (N/A, 06/30/2014); MOLERS (2007); and CESAREAN SECTION (N/A, 12/26/2016).      VITAL SIGNS:   Body mass index is 29.52 kg/m.  Patient Vitals for the past 12  hrs:   BP Temp Pulse Resp   12/28/16 0608 105/54 98.1 F (36.7 C) 74 18       12/26/16  Per notes : BS increased to 176 after D5LR bolus of 500 mls and now dropped to 75 on DR LR solution ongoing.       Delivery report : Term 12/26/16  8 lbs 2.3 ounces or 3.695 kg baby girl Ajna Moors .   EDD 01/09/17 .  Presentaion Vertex , C section :  Lower type   Apgar : 8 at 1 min  And 9 at 5 min.   Infant : Length 30.47 inches , Head circumference 36 cm .     Payer : Payor: TRICARE / Plan: [employer] [place] / Product Type: COMMERCIAL /       DISPO:   On 12/27/16 patient is still inhouse.   Breastfeeding baby  - Nursery  , q 3 hours or on demand , blood sugars, Output WNL.     INPT AUTH# [id number]  BABY'S MRN: [id number]  Fax to : [phone]      West Carbo RN, BSN   Utilization Review RN  [facility]   [phone]

## 2016-12-28 NOTE — Plan of Care (Signed)
Patient is stable and without questions or concerns at this time.  POC reviewed this shift and patient verbalizes understanding and continues to progress.    Patient pain level within patient stated goal: Yes  Patient breastfeeding infant: Yes  Patient pumping: Yes  Patient lochia appropriate: Yes, rubra/scant  Foley out and voiding: Yes  Abdominal incision clean, dry and well approximated: Yes  Nutrition adequate for discharge: Yes  Patient reports constipation: No   Patient agrees to notify RN of any changes in her condition/status: Yes

## 2016-12-28 NOTE — Plan of Care (Signed)
Pt doing well. Pt vital signs stable and assessment WNL. Fundus firm with minimal bleeding noted. Incision clean dry intact with no drainage. Pt has +BSx4 quad and +flatus. Lungs clear bilateral. Encouraged pt to cough and deep breath. Pt breast feeding baby. Pt states pain well controlled with PO medication. Pt progressing towards goals. Will continue to educate pt towards discharge.    Discharge teaching completed and pt watched discharge video. Incision care handout given and reviewed with pt, and pt verbalized understanding.

## 2016-12-29 ENCOUNTER — Other Ambulatory Visit: Payer: Self-pay

## 2016-12-29 MED ORDER — MUPIROCIN 2 % EX OINT
0 refills | Status: AC
Start: 2016-12-29 — End: ?
  Filled 2016-12-29: qty 44, 12d supply, fill #0

## 2016-12-29 NOTE — Discharge Instr - AVS First Page (Signed)
Reason for your Hospital Admission:  Delivery of my baby      Instructions for after your discharge:  Call if fever or heavy vaginal bleeding   Observe for mastitis and postpartum depression

## 2016-12-29 NOTE — Discharge Summary (Signed)
DISCHARGE NOTE    Date Time: 12/29/16 8:10 AM  Patient Name: [facility]  Attending Physician: Loney Loh, MD    Date of Admission:   12/26/2016    Date of Discharge:   12/29/16    Reason for Admission:   Maternal care for low transverse scar from previous cesarean delivery [O34.211]    Problems:   Lists the present on admission hospital problems  [facility]  [facility]:  Active Problems:    [redacted] weeks gestation of pregnancy      Problem Lists:  Patient Active Problem List   Diagnosis   . Type 1 diabetes mellitus with hypoglycemia and without coma   . Post partum depression   . Mild intermittent asthma without complication   . B12 deficiency   . Pernicious anemia   . Second trimester pregnancy   . [redacted] weeks gestation of pregnancy                    Discharge Dx:   iup 38 w delivered   Previous c/s x4/  Repeat c/s.  Type I DM ON INDULIN PUMP.  PP DEPRESION    Consultations:   ANESTHESIA, LACTATION    Procedures performed:   REPEAT C/SECTION    Hospital Course:   NORMAL     Discharge Medications:     Current Discharge Medication List      START taking these medications    Details   mupirocin-miconazole-betamethasone diprop in petrolatum (APNO) ointment Apply to nipples  Qty: 44 g, Refills: 0         CONTINUE these medications which have NOT CHANGED    Details   Insulin Syringe-Needle U-100 (BD INSULIN SYRINGE ULTRAFINE) 31G X 5/16" 0.5 ML Misc Use 1 to inject insulin as needed  Qty: 50 each, Refills: 0    Associated Diagnoses: Type 1 diabetes mellitus without complication      Prenat-FeFum-Fered-FA-DHA w/oA (PRENA1 PEARL) 30-1.4-200 MG Cap CR TK 1 C PO QD  Refills: 5      albuterol (PROVENTIL HFA;VENTOLIN HFA) 108 (90 BASE) MCG/ACT inhaler Inhale 2 puffs into the lungs every 6 (six) hours as needed for Wheezing.      glucose blood test strip One touch ultra test strips. Use 1 strip 8 times a day  Qty: 250 each, Refills: 11    Associated Diagnoses: Type 1 diabetes mellitus without  complication      Ketone Blood Test Strip Use 1 as needed to check for urine ketones  Qty: 10 each, Refills: 0    Associated Diagnoses: Type 1 diabetes mellitus without complication         STOP taking these medications       aspirin 81 MG tablet        insulin lispro (HUMALOG) 100 UNIT/ML injection        amoxicillin (AMOXIL) 875 MG tablet        benzonatate (TESSALON) 100 MG capsule        glucagon 1 MG injection                Discharge Instructions:   CALL IF FEVER OR HEAVY VAGINAL BLEEDING    Follow-up with DR Felipa Furnace in 2 WEEKS      Signed by: Velta Addison

## 2016-12-29 NOTE — Hospital Course (Signed)
Relevant Summary for Hospital Course:      day 3 pp s/p repeat c/s  Beast feeding ,c/o cracked nipple . apno cream ordered  No fever or chills, no urinary or bowel complaint  type1DM ON INSULIN PUMP. BLOOD SUGAR WELL CONTROLED.  ALERT ,ACTIVE, ORIENTED X3 NO DISTRESS. VS STABLE, AFEBRILE.  ABDOMEN SOFT,BS+, BM +_. INCISION CLEAN AND DRY. UTERUS FIRM, INVOLUTING. LOCHIA SCANT  EXTREMITIES NO PAIN NO EDEMA  Extremities no pain  No edema.  Satisfactory pp care . Home today .instructed . F/u by pcp

## 2016-12-29 NOTE — Plan of Care (Signed)
Patient discharged with baby. Patient verbalizes understanding of postpartum and newborn care.  Patient voices no concerns and has no questions at this time. MOB refusing to start double phototherapy on baby stated "I just want to go home now and I can follow up with my peds tomorrow." Explained all the risks of not starting phototx asap and also informed that Dr. Warren Danes will also be notified of mothers wishes.

## 2017-02-28 ENCOUNTER — Other Ambulatory Visit (INDEPENDENT_AMBULATORY_CARE_PROVIDER_SITE_OTHER): Payer: Self-pay | Admitting: Family Medicine

## 2017-03-21 ENCOUNTER — Ambulatory Visit (INDEPENDENT_AMBULATORY_CARE_PROVIDER_SITE_OTHER): Admitting: Internal Medicine

## 2017-03-21 ENCOUNTER — Encounter (INDEPENDENT_AMBULATORY_CARE_PROVIDER_SITE_OTHER): Payer: Self-pay | Admitting: Internal Medicine

## 2017-03-21 ENCOUNTER — Ambulatory Visit (INDEPENDENT_AMBULATORY_CARE_PROVIDER_SITE_OTHER): Admitting: Family

## 2017-03-21 VITALS — BP 117/75 | HR 69 | Temp 98.0°F | Wt 160.0 lb

## 2017-03-21 DIAGNOSIS — R21 Rash and other nonspecific skin eruption: Secondary | ICD-10-CM

## 2017-03-21 DIAGNOSIS — E538 Deficiency of other specified B group vitamins: Secondary | ICD-10-CM

## 2017-03-21 MED ORDER — FLUOCINOLONE ACETONIDE 0.01 % EX CREA
TOPICAL_CREAM | Freq: Two times a day (BID) | CUTANEOUS | 0 refills | Status: AC
Start: 2017-03-21 — End: ?

## 2017-03-21 MED ORDER — CYANOCOBALAMIN 1000 MCG/ML IJ SOLN
1000.0000 ug | Freq: Once | INTRAMUSCULAR | Status: AC
Start: 2017-03-21 — End: 2017-03-21
  Administered 2017-03-21: 1000 ug via INTRAMUSCULAR

## 2017-03-21 NOTE — Progress Notes (Signed)
Subjective:      Patient ID: Allison Salazar is a 31 y.o. female     Chief Complaint   Patient presents with   . Rash     itchy, 5 days, upper body        Rash   This is a new problem. The current episode started in the past 7 days. The problem has been waxing and waning since onset. The rash is diffuse. Pertinent negatives include no congestion, cough, diarrhea, facial edema, fatigue, fever, joint pain, rhinorrhea, shortness of breath or sore throat. Past treatments include anti-itch cream. The treatment provided mild relief.        The following sections were reviewed this encounter by the provider:   Tobacco         Review of Systems   Constitutional: Negative for fatigue and fever.   HENT: Negative for congestion, rhinorrhea and sore throat.    Respiratory: Negative for cough and shortness of breath.    Gastrointestinal: Negative for diarrhea.   Musculoskeletal: Negative for joint pain.   Skin: Positive for rash.          BP 117/75   Pulse 69   Temp 98 F (36.7 C) (Oral)   Wt 72.6 kg (160 lb)   Breastfeeding? Yes   BMI 27.46 kg/m     Objective:     Physical Exam     General appearance - alert, well appearing, and in no distress  Mental status - alert, oriented to person, place, and time  Eyes - pupils equal and reactive,  Nose - normal and patent, no erythema  Chest - clear to auscultation, no wheezes, rales or rhonchi, symmetric air entry  Heart - normal rate, regular rhythm, normal S1, S2, no murmurs,   Neurological - alert, oriented, normal speech,  Musculoskeletal - no joint tenderness, deformity or swelling  Skin -rash positive chest/back    Patient Active Problem List   Diagnosis   . Type 1 diabetes mellitus with hypoglycemia and without coma   . Post partum depression   . Mild intermittent asthma without complication   . B12 deficiency   . Pernicious anemia   . Second trimester pregnancy   . [redacted] weeks gestation of pregnancy     Current Outpatient Prescriptions   Medication Sig Dispense Refill   .  Prenat-FeFum-Fered-FA-DHA w/oA (PRENA1 PEARL) 30-1.4-200 MG Cap CR TK 1 C PO QD  5   . sertraline (ZOLOFT) 50 MG tablet TK 1 T PO QD  1   . albuterol (PROVENTIL HFA;VENTOLIN HFA) 108 (90 BASE) MCG/ACT inhaler Inhale 2 puffs into the lungs every 6 (six) hours as needed for Wheezing.     . fluocinolone 0.01 % cream Apply topically 2 (two) times daily. 60 g 0   . glucose blood test strip One touch ultra test strips. Use 1 strip 8 times a day 250 each 11   . Insulin Syringe-Needle U-100 (BD INSULIN SYRINGE ULTRAFINE) 31G X 5/16" 0.5 ML Misc Use 1 to inject insulin as needed 50 each 0   . Ketone Blood Test Strip Use 1 as needed to check for urine ketones 10 each 0   . mupirocin-miconazole-betamethasone diprop in petrolatum (APNO) ointment Apply to nipples 44 g 0     No current facility-administered medications for this visit.      Allergies   Allergen Reactions   . Latex Dermatitis       Assessment:/plan     1. Rash  - Ambulatory referral  to Dermatology    2. Vitamin B12 deficiency  - cyanocobalamin (VITAMIN B12) injection 1,000 mcg; Inject 1 mL (1,000 mcg total) into the muscle once.            Benadryl/hydrocortisone           Pershing Cox, MD

## 2017-03-27 ENCOUNTER — Telehealth (INDEPENDENT_AMBULATORY_CARE_PROVIDER_SITE_OTHER): Payer: Self-pay | Admitting: Endocrinology, Diabetes and Metabolism

## 2017-03-27 ENCOUNTER — Other Ambulatory Visit (INDEPENDENT_AMBULATORY_CARE_PROVIDER_SITE_OTHER): Payer: Self-pay | Admitting: Endocrinology, Diabetes and Metabolism

## 2017-03-27 MED ORDER — INSULIN LISPRO 100 UNIT/ML SC SOLN
SUBCUTANEOUS | 0 refills | Status: AC
Start: 2017-03-27 — End: ?

## 2017-03-27 NOTE — Telephone Encounter (Signed)
Prescription ready for pick up.

## 2017-03-27 NOTE — Telephone Encounter (Signed)
Please call patient to make follow up appointment. Patient was due back in Jan 2018. I will then set up prescription. Please send appt date back to me. Thank you.

## 2017-03-27 NOTE — Telephone Encounter (Signed)
HUMALOG VIAL    100 UNITS PER DAY    WRITTEN RX PLEASE    [phone] HUSBAND (ISAMI)

## 2017-03-27 NOTE — Telephone Encounter (Signed)
Husband aware that patient needs an appointment. Husband to have patient call.cc

## 2017-05-23 ENCOUNTER — Encounter (INDEPENDENT_AMBULATORY_CARE_PROVIDER_SITE_OTHER): Admitting: Internal Medicine

## 2017-05-24 ENCOUNTER — Ambulatory Visit (INDEPENDENT_AMBULATORY_CARE_PROVIDER_SITE_OTHER): Admitting: Family Medicine

## 2017-05-24 ENCOUNTER — Ambulatory Visit (INDEPENDENT_AMBULATORY_CARE_PROVIDER_SITE_OTHER): Admitting: Internal Medicine

## 2017-06-05 ENCOUNTER — Telehealth (INDEPENDENT_AMBULATORY_CARE_PROVIDER_SITE_OTHER): Payer: Self-pay

## 2017-06-05 NOTE — Telephone Encounter (Signed)
Pt left message on VM regarding f/u appt for DM

## 2017-06-20 ENCOUNTER — Telehealth (INDEPENDENT_AMBULATORY_CARE_PROVIDER_SITE_OTHER): Payer: Self-pay | Admitting: Family Medicine

## 2017-06-20 NOTE — Telephone Encounter (Signed)
Spoke with pt.  Pt states did not go back to Dr. Eula Listen.   Pt states did not feel comfortable with Endo physician.   Her DM was managed by OBGYN during her pregnancy.   Now at post partum, she does not feel to see any physician regarding her DM since " it is under control and always been under 6, frankly I feel more comfortable managing my DM myself"  Asked pt if she will be following up or plan to come and see Dr. Dell Ponto.  Pt answers " I will see her for other medical reason such as cold, strep but not to manage my DM"

## 2017-06-20 NOTE — Telephone Encounter (Signed)
Please check her DM status, no A1C since 2017, seems she hasn't seen endo for a while.  Please update endo info and update A1C.  If she hasn't checked, she may f/u for annual physical.

## 2017-10-29 ENCOUNTER — Telehealth (INDEPENDENT_AMBULATORY_CARE_PROVIDER_SITE_OTHER): Payer: Self-pay | Admitting: Endocrinology, Diabetes and Metabolism

## 2017-10-29 ENCOUNTER — Encounter (INDEPENDENT_AMBULATORY_CARE_PROVIDER_SITE_OTHER): Payer: Self-pay | Admitting: Endocrinology, Diabetes and Metabolism

## 2017-10-29 NOTE — Telephone Encounter (Signed)
Please call pt pt with the following recommendations:    Last seen over 1 year ago, needs to be seen in office for continued refills and paperwork.    Let me know if she make an appointment. If there is no response or declines to fu, we will send a letter.

## 2017-10-29 NOTE — Telephone Encounter (Signed)
Lm for pt to call back and schedule a f/u apt

## 2017-11-29 ENCOUNTER — Encounter (INDEPENDENT_AMBULATORY_CARE_PROVIDER_SITE_OTHER): Payer: Self-pay | Admitting: Endocrinology, Diabetes and Metabolism

## 2017-11-29 ENCOUNTER — Telehealth (INDEPENDENT_AMBULATORY_CARE_PROVIDER_SITE_OTHER): Payer: Self-pay | Admitting: Endocrinology, Diabetes and Metabolism

## 2017-11-29 NOTE — Telephone Encounter (Signed)
PT HAS NOT BEEN SEEN IN OVER A YEAR. NO FURTHE REFILLS. LETTER SENT.

## 2018-10-15 ENCOUNTER — Encounter (INDEPENDENT_AMBULATORY_CARE_PROVIDER_SITE_OTHER): Payer: Self-pay | Admitting: Endocrinology, Diabetes and Metabolism

## 2019-03-20 ENCOUNTER — Other Ambulatory Visit: Payer: Self-pay

## 2019-03-20 ENCOUNTER — Emergency Department

## 2019-03-20 ENCOUNTER — Emergency Department
Admission: EM | Admit: 2019-03-20 | Discharge: 2019-03-21 | Disposition: A | Discharge status: Home / Self Care | Attending: Student in an Organized Health Care Education/Training Program | Admitting: Student in an Organized Health Care Education/Training Program

## 2019-03-20 DIAGNOSIS — E109 Type 1 diabetes mellitus without complications: Secondary | ICD-10-CM | POA: Insufficient documentation

## 2019-03-20 DIAGNOSIS — N12 Tubulo-interstitial nephritis, not specified as acute or chronic: Secondary | ICD-10-CM | POA: Insufficient documentation

## 2019-03-20 DIAGNOSIS — Z79899 Other long term (current) drug therapy: Secondary | ICD-10-CM | POA: Insufficient documentation

## 2019-03-20 DIAGNOSIS — Z1159 Encounter for screening for other viral diseases: Secondary | ICD-10-CM | POA: Diagnosis not present

## 2019-03-20 DIAGNOSIS — R509 Fever, unspecified: Secondary | ICD-10-CM

## 2019-03-20 DIAGNOSIS — B999 Unspecified infectious disease: Secondary | ICD-10-CM

## 2019-03-20 HISTORY — DX: Vitamin D deficiency, unspecified: E55.9

## 2019-03-20 HISTORY — DX: Type 2 diabetes mellitus without complications: E11.9

## 2019-03-20 HISTORY — DX: Unspecified asthma, uncomplicated: J45.909

## 2019-03-20 HISTORY — DX: Anemia, unspecified: D64.9

## 2019-03-20 LAB — COMPREHENSIVE METABOLIC PANEL
ALT: 13 U/L (ref 0–44)
AST: 14 U/L — ABNORMAL LOW (ref 15–41)
Albumin: 4.6 g/dL (ref 3.5–5.0)
Alkaline Phosphatase: 76 U/L (ref 38–126)
Anion gap: 11 (ref 5–15)
BUN: 12 mg/dL (ref 6–20)
CO2: 23 mmol/L (ref 22–32)
Calcium: 9.1 mg/dL (ref 8.9–10.3)
Chloride: 104 mmol/L (ref 98–111)
Creatinine, Ser: 0.71 mg/dL (ref 0.44–1.00)
GFR calc Af Amer: >60 mL/min (ref >60)
GFR calc non Af Amer: >60 mL/min (ref >60)
Glucose, Bld: 127 mg/dL — ABNORMAL HIGH (ref 70–99)
Potassium: 3.6 mmol/L (ref 3.5–5.1)
Sodium: 138 mmol/L (ref 135–145)
Total Bilirubin: 1.3 mg/dL — ABNORMAL HIGH (ref 0.3–1.2)
Total Protein: 7.3 g/dL (ref 6.5–8.1)

## 2019-03-20 LAB — CBC WITH DIFFERENTIAL/PLATELET
Abs Immature Granulocytes: 0.05 10*3/uL (ref 0.00–0.07)
Basophils Absolute: 0 10*3/uL (ref 0.0–0.1)
Basophils Relative: 0 %
Eosinophils Absolute: 0 10*3/uL (ref 0.0–0.5)
Eosinophils Relative: 0 %
HCT: 35.9 % — ABNORMAL LOW (ref 36.0–46.0)
Hemoglobin: 12.2 g/dL (ref 12.0–15.0)
Immature Granulocytes: 1 %
Lymphocytes Relative: 4 %
Lymphs Abs: 0.4 10*3/uL — ABNORMAL LOW (ref 0.7–4.0)
MCH: 30.7 pg (ref 26.0–34.0)
MCHC: 34 g/dL (ref 30.0–36.0)
MCV: 90.4 fL (ref 80.0–100.0)
Monocytes Absolute: 0.3 10*3/uL (ref 0.1–1.0)
Monocytes Relative: 3 %
Neutro Abs: 7.9 10*3/uL — ABNORMAL HIGH (ref 1.7–7.7)
Neutrophils Relative %: 92 %
Platelets: 242 10*3/uL (ref 150–400)
RBC: 3.97 MIL/uL (ref 3.87–5.11)
RDW: 12.3 % (ref 11.5–15.5)
WBC: 8.6 10*3/uL (ref 4.0–10.5)
nRBC: 0 % (ref 0.0–0.2)

## 2019-03-20 LAB — URINALYSIS, COMPLETE (UACMP) WITH MICROSCOPIC
Bacteria, UA: NONE SEEN
Bilirubin Urine: NEGATIVE
Glucose, UA: NEGATIVE mg/dL
Ketones, ur: 20 mg/dL — AB
Leukocytes,Ua: NEGATIVE
Nitrite: NEGATIVE
Protein, ur: NEGATIVE mg/dL
Specific Gravity, Urine: 1.021 (ref 1.005–1.030)
pH: 5 (ref 5.0–8.0)

## 2019-03-20 LAB — SARS CORONAVIRUS 2 BY RT PCR (HOSPITAL ORDER, PERFORMED IN ~~LOC~~ HOSPITAL LAB): SARS Coronavirus 2: NEGATIVE

## 2019-03-20 LAB — POCT PREGNANCY, URINE: Preg Test, Ur: NEGATIVE

## 2019-03-20 LAB — LACTIC ACID, PLASMA: Lactic Acid, Venous: 0.7 mmol/L (ref 0.5–1.9)

## 2019-03-20 MED ORDER — SODIUM CHLORIDE 0.9 % IV BOLUS
1000.0000 mL | Freq: Once | INTRAVENOUS | Status: AC
  Administered 2019-03-20: 1000 mL via INTRAVENOUS

## 2019-03-20 MED ORDER — SODIUM CHLORIDE 0.9 % IV SOLN
1.0000 g | Freq: Once | INTRAVENOUS | Status: AC
  Administered 2019-03-20: 1 g via INTRAVENOUS

## 2019-03-20 MED ORDER — CEPHALEXIN 500 MG PO CAPS: 500.0000 mg | ORAL_CAPSULE | Freq: Three times a day (TID) | ORAL | 0 refills | Status: AC

## 2019-03-20 NOTE — ED Notes (Signed)
Ultrasound at bedside

## 2019-03-20 NOTE — Discharge Instructions (Addendum)
1.  Alternate Tylenol and Ibuprofen every 4 hours as needed for fever greater than 100.4 F. 2.  Change your antibiotic to Keflex 500 mg 3 times daily x7 days. 3.  Blood and urine cultures are pending.  You will be notified of any positive results requiring a change in your antibiotic. 4.  Return to the ER for worsening symptoms, persistent vomiting, difficulty breathing or other concerns.

## 2019-03-20 NOTE — ED Triage Notes (Signed)
Patient c/o fever, body aches, headache. Patient home temp 102, took 800 ibuprofen 30 minutes. Patient being treated for UTI, on antibiotics for UTI, taken 2 doses so far.

## 2019-03-20 NOTE — ED Provider Notes (Signed)
[facility] Emergency Department Provider Note    First MD Initiated Contact with Patient 03/20/19 1948     (approximate)  I have reviewed the triage vital signs and the nursing notes.   HISTORY  Chief Complaint Fever and Generalized Body Aches    HPI Wanda Bennett is a 33 y.o. female   with a history of type 1 diabetes presents the ER for evaluation of fever myalgias chills muscle aches.  Was diagnosed with urinary tract infection yesterday.  She is therefore taken to doses of her antibiotics.  States that she was outside part of the kids throughout the day but then towards the end of the day started feeling fatigued weak with muscle aches.  Is not been having any cough or shortness of breath.  No congestion.  Denies any chest pains.  No vomiting.  No diarrhea.   Past Medical History:  Diagnosis Date  . Anemia   . Asthma   . Diabetes mellitus without complication ([place])   . Vitamin D deficiency    No family history on file. Past Surgical History:  Procedure Laterality Date  . APPENDECTOMY    . CESAREAN SECTION     There are no active problems to display for this patient.     Prior to Admission medications   Medication Sig Start Date End Date Taking? Authorizing Provider  cephALEXin (KEFLEX) 500 MG capsule Take 1 capsule (500 mg total) by mouth 3 (three) times daily for 7 days. 03/20/19 03/27/19  Merlyn Lot, MD    Allergies Latex and Mango flavor    Social History Social History   Tobacco Use  . Smoking status: Never Smoker  . Smokeless tobacco: Never Used  Substance Use Topics  . Alcohol use: Not on file  . Drug use: Not on file    Review of Systems Patient denies headaches, rhinorrhea, blurry vision, numbness, shortness of breath, chest pain, edema, cough, abdominal pain, nausea, vomiting, diarrhea, dysuria, fevers, rashes or hallucinations unless otherwise stated above in HPI. ____________________________________________    PHYSICAL EXAM:  VITAL SIGNS: Vitals:   03/20/19 2204 03/20/19 2230  BP: 113/72 103/70  Pulse: (!) 101 95  Resp: 18 18  Temp:    SpO2: 98% 97%    Constitutional: Alert and oriented.  Eyes: Conjunctivae are normal.  Head: Atraumatic. Nose: No congestion/rhinnorhea. Mouth/Throat: Mucous membranes are moist.   Neck: No stridor. Painless ROM.  Cardiovascular: Normal rate, regular rhythm. Grossly normal heart sounds.  Good peripheral circulation. Respiratory: Normal respiratory effort.  No retractions. Lungs CTAB. Gastrointestinal: Soft and nontender. No distention. No abdominal bruits. No CVA tenderness. Genitourinary:  Musculoskeletal: No lower extremity tenderness nor edema.  No joint effusions. Neurologic:  Normal speech and language. No gross focal neurologic deficits are appreciated. No facial droop Skin:  Skin is warm, dry and intact. No rash noted. Psychiatric: Mood and affect are normal. Speech and behavior are normal.  ____________________________________________   LABS (all labs ordered are listed, but only abnormal results are displayed)  Results for orders placed or performed during the hospital encounter of 03/20/19 (from the past 24 hour(s))  Lactic acid, plasma     Status: None   Collection Time: 03/20/19  8:44 PM  Result Value Ref Range   Lactic Acid, Venous 0.7 0.5 - 1.9 mmol/L  Comprehensive metabolic panel     Status: Abnormal   Collection Time: 03/20/19  8:44 PM  Result Value Ref Range   Sodium 138 135 - 145 mmol/L  Potassium 3.6 3.5 - 5.1 mmol/L   Chloride 104 98 - 111 mmol/L   CO2 23 22 - 32 mmol/L   Glucose, Bld 127 (H) 70 - 99 mg/dL   BUN 12 6 - 20 mg/dL   Creatinine, Ser 0.71 0.44 - 1.00 mg/dL   Calcium 9.1 8.9 - 10.3 mg/dL   Total Protein 7.3 6.5 - 8.1 g/dL   Albumin 4.6 3.5 - 5.0 g/dL   AST 14 (L) 15 - 41 U/L   ALT 13 0 - 44 U/L   Alkaline Phosphatase 76 38 - 126 U/L   Total Bilirubin 1.3 (H) 0.3 - 1.2 mg/dL   GFR calc non Af Amer >60 >60  mL/min   GFR calc Af Amer >60 >60 mL/min   Anion gap 11 5 - 15  CBC with Differential     Status: Abnormal   Collection Time: 03/20/19  8:44 PM  Result Value Ref Range   WBC 8.6 4.0 - 10.5 K/uL   RBC 3.97 3.87 - 5.11 MIL/uL   Hemoglobin 12.2 12.0 - 15.0 g/dL   HCT 35.9 (L) 36.0 - 46.0 %   MCV 90.4 80.0 - 100.0 fL   MCH 30.7 26.0 - 34.0 pg   MCHC 34.0 30.0 - 36.0 g/dL   RDW 12.3 11.5 - 15.5 %   Platelets 242 150 - 400 K/uL   nRBC 0.0 0.0 - 0.2 %   Neutrophils Relative % 92 %   Neutro Abs 7.9 (H) 1.7 - 7.7 K/uL   Lymphocytes Relative 4 %   Lymphs Abs 0.4 (L) 0.7 - 4.0 K/uL   Monocytes Relative 3 %   Monocytes Absolute 0.3 0.1 - 1.0 K/uL   Eosinophils Relative 0 %   Eosinophils Absolute 0.0 0.0 - 0.5 K/uL   Basophils Relative 0 %   Basophils Absolute 0.0 0.0 - 0.1 K/uL   Immature Granulocytes 1 %   Abs Immature Granulocytes 0.05 0.00 - 0.07 K/uL  Urinalysis, Complete w Microscopic     Status: Abnormal   Collection Time: 03/20/19  8:44 PM  Result Value Ref Range   Color, Urine AMBER (A) YELLOW   APPearance CLEAR (A) CLEAR   Specific Gravity, Urine 1.021 1.005 - 1.030   pH 5.0 5.0 - 8.0   Glucose, UA NEGATIVE NEGATIVE mg/dL   Hgb urine dipstick SMALL (A) NEGATIVE   Bilirubin Urine NEGATIVE NEGATIVE   Ketones, ur 20 (A) NEGATIVE mg/dL   Protein, ur NEGATIVE NEGATIVE mg/dL   Nitrite NEGATIVE NEGATIVE   Leukocytes,Ua NEGATIVE NEGATIVE   RBC / HPF 0-5 0 - 5 RBC/hpf   WBC, UA 0-5 0 - 5 WBC/hpf   Bacteria, UA NONE SEEN NONE SEEN   Squamous Epithelial / LPF 0-5 0 - 5   Mucus PRESENT   SARS Coronavirus 2 (CEPHEID - Performed in [place] hospital lab), Hosp Order     Status: None   Collection Time: 03/20/19  8:44 PM  Result Value Ref Range   SARS Coronavirus 2 NEGATIVE NEGATIVE  Pregnancy, urine POC     Status: None   Collection Time: 03/20/19  8:52 PM  Result Value Ref Range   Preg Test, Ur NEGATIVE NEGATIVE   ____________________________________________  ____________________________________________  RADIOLOGY  I personally reviewed all radiographic images ordered to evaluate for the above acute complaints and reviewed radiology reports and findings.  These findings were personally discussed with the patient.  Please see medical record for radiology report.  ____________________________________________   PROCEDURES  Procedure(s) performed:  Procedures    Critical Care performed: no ____________________________________________   INITIAL IMPRESSION / ASSESSMENT AND PLAN / ED COURSE  Pertinent labs & imaging results that were available during my care of the patient were reviewed by me and considered in my medical decision making (see chart for details).   DDX: uti, pyelo, uri, pna, covid  Casi Westerfeld is a 33 y.o. who presents to the ED with Tums as described above.  Patient nontoxic-appearing but presentation somewhat complicated by her being type I diabetic.  Her abdominal exam is soft and benign.  No respiratory distress.  Blood will be sent off for above differential.  Have high suspicion for pyelonephritis.  Possible stone.  We will also treat for coronavirus.  Will give IV fluids obtain imaging to evaluate for hydro-obstructive pathology.  She is already status post appendectomy.  No epigastric discomfort to suggest biliary pathology.  Clinical Course as of Mar 19 2304  Thu Mar 20, 2019  2227 Coronavirus is negative.  Patient receiving IV antibiotics clinically improving.  Will order ultrasound to exclude hydro-or stone.   [PR]    Clinical Course User Index [PR] Merlyn Lot, MD   Patient be signed out to oncoming physician pending results of ultrasound.  Anticipate discharge home she is well-appearing.  Have ordered antibiotic sent to her pharmacy.  The patient was evaluated in Emergency Department today for the symptoms described in the history of present illness. He/she was evaluated in the context of the global  COVID-19 pandemic, which necessitated consideration that the patient might be at risk for infection with the SARS-CoV-2 virus that causes COVID-19. Institutional protocols and algorithms that pertain to the evaluation of patients at risk for COVID-19 are in a state of rapid change based on information released by regulatory bodies including the CDC and federal and state organizations. These policies and algorithms were followed during the patient's care in the ED.   As part of my medical decision making, I reviewed the following data within the [place] notes reviewed and incorporated, Labs reviewed, notes from prior ED visits and [place] Controlled Substance Database   ____________________________________________   FINAL CLINICAL IMPRESSION(S) / ED DIAGNOSES  Final diagnoses:  Pyelonephritis      NEW MEDICATIONS STARTED DURING THIS VISIT:  New Prescriptions   CEPHALEXIN (KEFLEX) 500 MG CAPSULE    Take 1 capsule (500 mg total) by mouth 3 (three) times daily for 7 days.     Note:  This document was prepared using Dragon voice recognition software and may include unintentional dictation errors.    Merlyn Lot, MD 03/20/19 [phone]

## 2019-03-21 NOTE — ED Provider Notes (Signed)
-----------------------------------------   12:20 AM on 03/21/2019 -----------------------------------------  Updated patient on ultrasound result.  She is feeling much better and desires discharge home.  Will switch patient from [place] to Keflex.  Will obtain blood cultures and urine culture prior to discharge.  Strict return precautions given.  Patient verbalizes understanding and agrees with plan of care.   Paulette Blanch, MD 03/21/19 [phone]

## 2019-03-22 LAB — URINE CULTURE: Culture: NO GROWTH

## 2019-03-26 LAB — CULTURE, BLOOD (ROUTINE X 2)
Culture: NO GROWTH
Culture: NO GROWTH
Special Requests: ADEQUATE

## 2019-12-10 DIAGNOSIS — L578 Other skin changes due to chronic exposure to nonionizing radiation: Secondary | ICD-10-CM | POA: Diagnosis not present

## 2019-12-10 DIAGNOSIS — D485 Neoplasm of uncertain behavior of skin: Secondary | ICD-10-CM | POA: Diagnosis not present

## 2019-12-10 DIAGNOSIS — L814 Other melanin hyperpigmentation: Secondary | ICD-10-CM | POA: Diagnosis not present

## 2019-12-10 DIAGNOSIS — D2272 Melanocytic nevi of left lower limb, including hip: Secondary | ICD-10-CM | POA: Diagnosis not present

## 2019-12-10 DIAGNOSIS — D229 Melanocytic nevi, unspecified: Secondary | ICD-10-CM | POA: Diagnosis not present

## 2019-12-10 DIAGNOSIS — D225 Melanocytic nevi of trunk: Secondary | ICD-10-CM | POA: Diagnosis not present

## 2019-12-10 DIAGNOSIS — L988 Other specified disorders of the skin and subcutaneous tissue: Secondary | ICD-10-CM | POA: Diagnosis not present

## 2019-12-10 DIAGNOSIS — Z1283 Encounter for screening for malignant neoplasm of skin: Secondary | ICD-10-CM | POA: Diagnosis not present

## 2019-12-10 DIAGNOSIS — Z86018 Personal history of other benign neoplasm: Secondary | ICD-10-CM | POA: Diagnosis not present

## 2020-01-05 DIAGNOSIS — E10649 Type 1 diabetes mellitus with hypoglycemia without coma: Secondary | ICD-10-CM | POA: Diagnosis not present

## 2020-01-30 ENCOUNTER — Encounter: Payer: Self-pay | Admitting: Surgery

## 2020-03-11 DIAGNOSIS — E1069 Type 1 diabetes mellitus with other specified complication: Secondary | ICD-10-CM | POA: Diagnosis not present

## 2020-03-11 DIAGNOSIS — E785 Hyperlipidemia, unspecified: Secondary | ICD-10-CM | POA: Diagnosis not present

## 2020-03-11 DIAGNOSIS — E10649 Type 1 diabetes mellitus with hypoglycemia without coma: Secondary | ICD-10-CM | POA: Diagnosis not present

## 2020-03-30 DIAGNOSIS — E10649 Type 1 diabetes mellitus with hypoglycemia without coma: Secondary | ICD-10-CM | POA: Diagnosis not present

## 2020-04-27 ENCOUNTER — Encounter: Payer: Self-pay | Admitting: Dermatology

## 2020-04-27 ENCOUNTER — Other Ambulatory Visit: Payer: Self-pay

## 2020-04-27 ENCOUNTER — Ambulatory Visit (INDEPENDENT_AMBULATORY_CARE_PROVIDER_SITE_OTHER): Payer: Self-pay | Admitting: Dermatology

## 2020-04-27 DIAGNOSIS — L988 Other specified disorders of the skin and subcutaneous tissue: Secondary | ICD-10-CM

## 2020-04-27 NOTE — Progress Notes (Signed)
    Follow-Up Visit   Subjective  Wanda Bennett is a 34 y.o. female who presents for the following: Facial Elastosis (patient is here today for Botox injections).  The following portions of the chart were reviewed this encounter and updated as appropriate:  Tobacco  Allergies  Meds  Problems  Med Hx  Surg Hx  Fam Hx     Review of Systems:  No other skin or systemic complaints except as noted in HPI or Assessment and Plan.  Objective  Well appearing patient in no apparent distress; mood and affect are within normal limits.  A focused examination was performed including the face. Relevant physical exam findings are noted in the Assessment and Plan.  Objective  Head - Anterior (Face): Rhytides and volume loss.   Images                Assessment & Plan    Elastosis of skin Head - Anterior (Face)  Botox 45 units injected as marked:  - frown complex x 20 units   - crow's feet 10 units each for a total of 20 units  - forehead x 5 units   Botox Injection - Head - Anterior (Face) Location: See attached image  Informed consent: Discussed risks (infection, pain, bleeding, bruising, swelling, allergic reaction, paralysis of nearby muscles, eyelid droop, double vision, neck weakness, difficulty breathing, headache, undesirable cosmetic result, and need for additional treatment) and benefits of the procedure, as well as the alternatives.  Informed consent was obtained.  Preparation: The area was cleansed with alcohol.  Procedure Details:  Botox was injected into the dermis with a 30-gauge needle. Pressure applied to any bleeding. Ice packs offered for swelling.  Lot Number: [id number] Expiration:  08/2022  Total Units Injected:  45  Plan: Patient was instructed to remain upright for 4 hours. Patient was instructed to avoid massaging the face and avoid vigorous exercise for the rest of the day. Tylenol may be used for headache.  Allow 2 weeks before returning to  clinic for additional dosing as needed. Patient will call for any problems.   Return in about 4 weeks (around 05/25/2020) for Botox recheck; 3-4 months for Botox injections.  Luther Redo, CMA, am acting as scribe for Sarina Ser, MD .  Documentation: I have reviewed the above documentation for accuracy and completeness, and I agree with the above.  Sarina Ser, MD

## 2020-05-23 IMAGING — US US RENAL
1 series · 14 of 25 positions shown · non-contrast
Comparison: None.

CLINICAL DATA: Bilateral flank pain

EXAM:
RENAL / URINARY TRACT ULTRASOUND COMPLETE

[Series 1: us renal · 0.26mm/px · 14 of 38 slices shown]
[im 1/38]
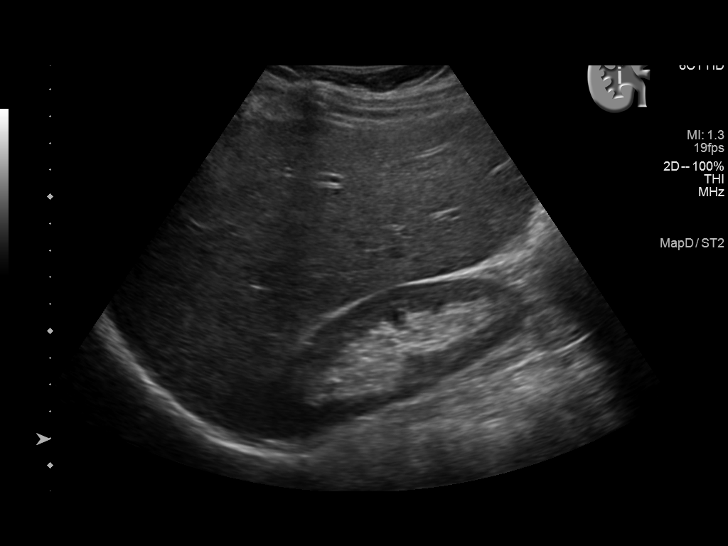
[im 4/38]
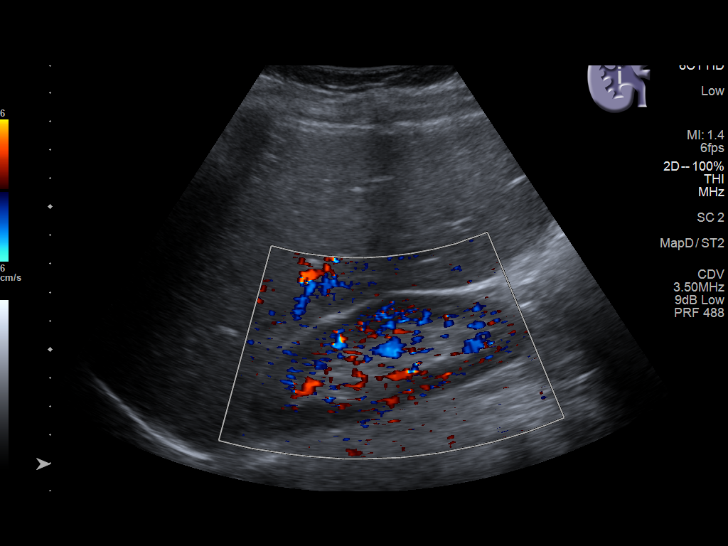
[im 7/38]
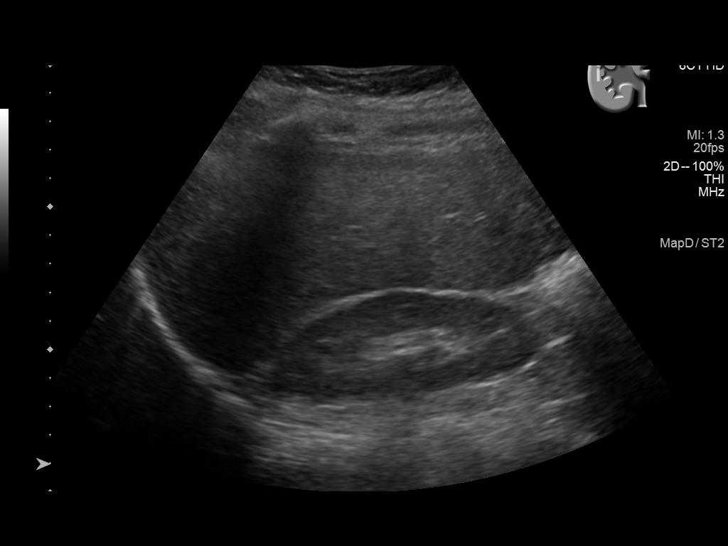
[im 10/38]
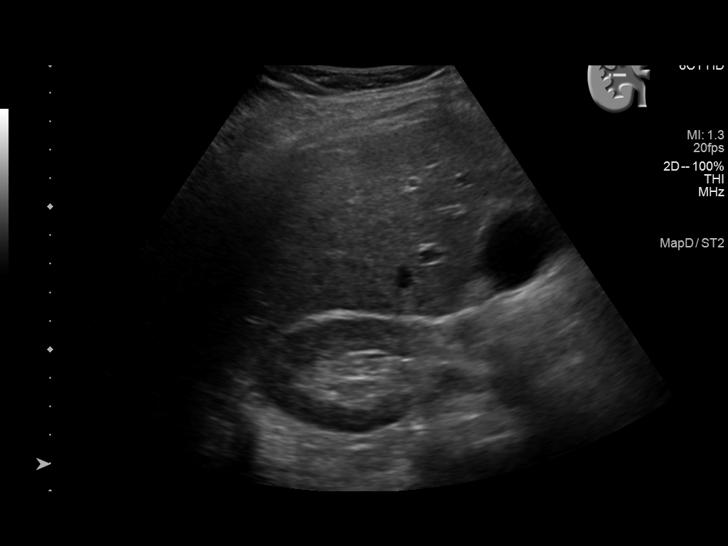
[im 13/38]
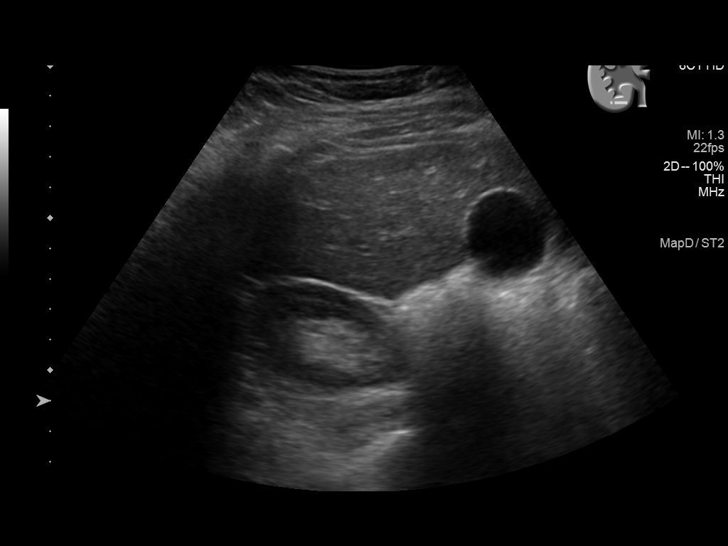
[im 14/38]
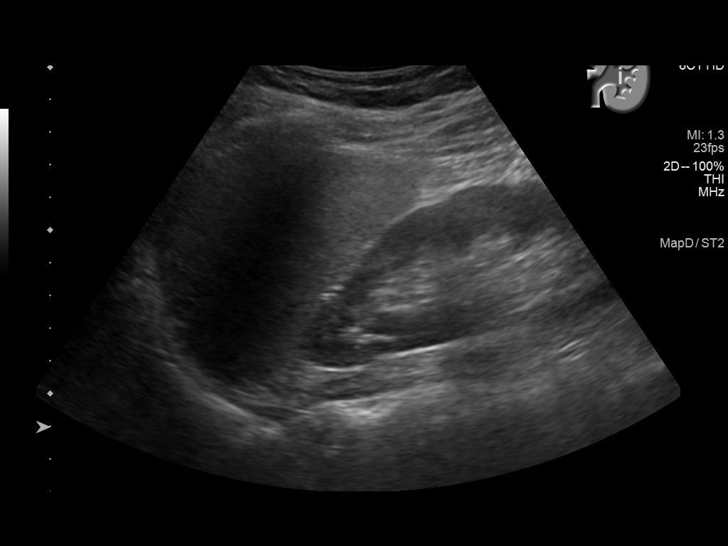
[im 17/38]
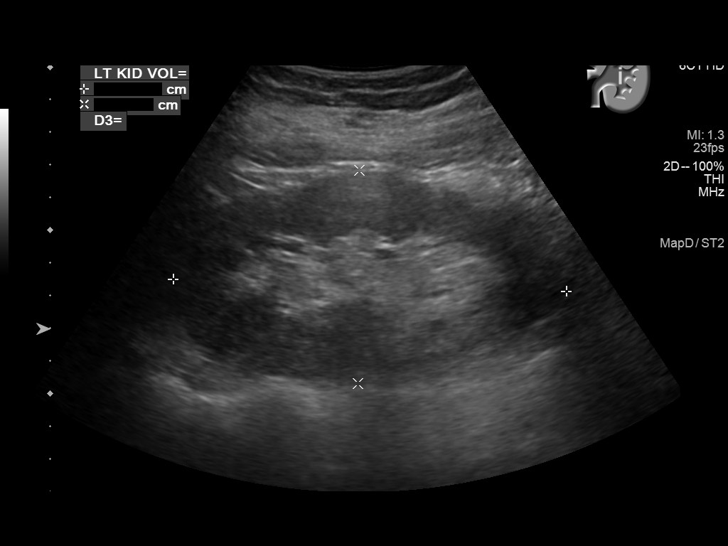
[im 21/38]
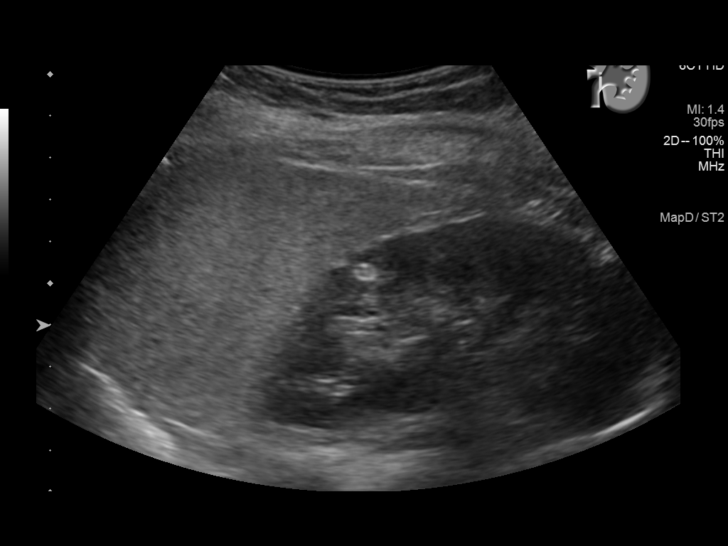
[im 24/38]
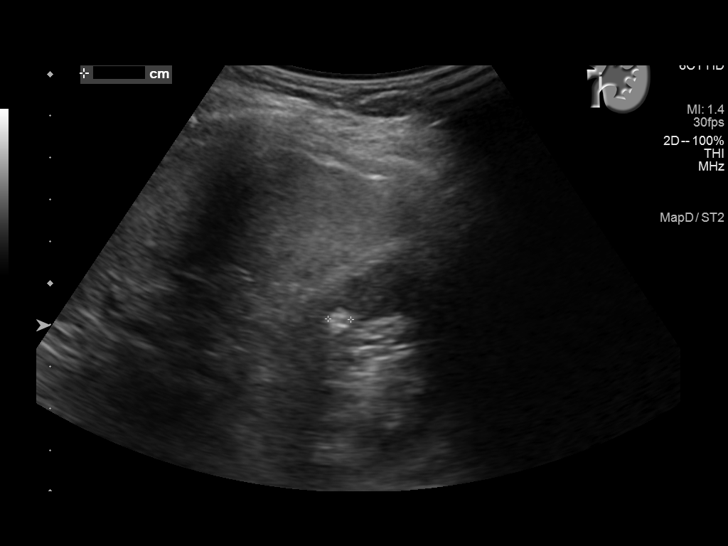
[im 25/38]
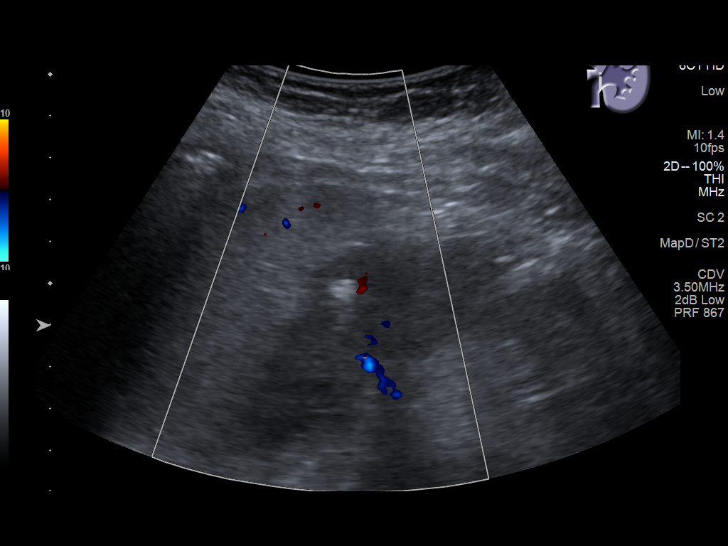
[im 28/38]
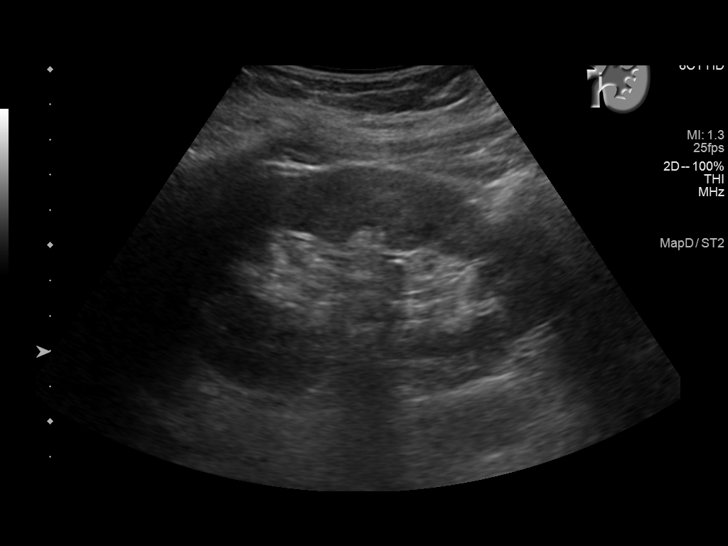
[im 31/38]
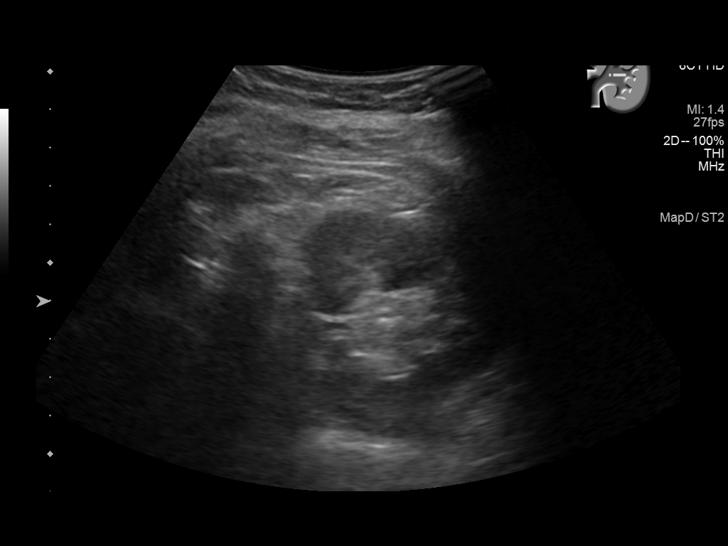
[im 34/38]
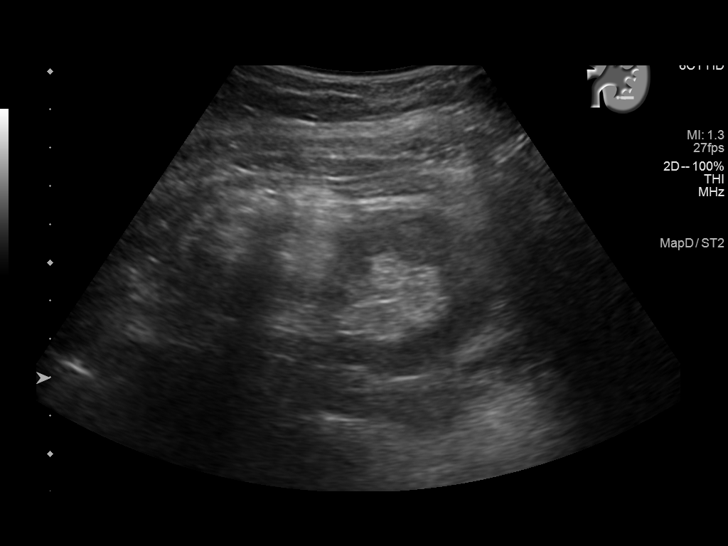
[im 38/38]
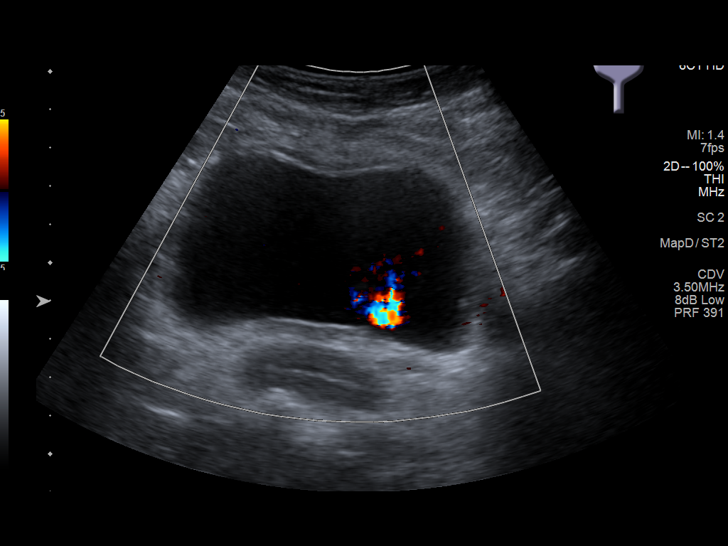

[14 of 25 positions shown; findings below may reference images not displayed]

FINDINGS: Right Kidney:

Renal measurements: 10.4 x 3.8 x 5.7 cm = volume: 116.8 mL .
Echogenicity within normal limits. No mass or hydronephrosis
visualized.

Left Kidney:

Renal measurements: 12.0 x 6.5 x 5.5 cm = volume: 224 mL. There is
no hydronephrosis. The echogenicity is within normal limits. There
is a 0.6 x 0.5 x 0.5 cm echogenic nodule in the upper pole of the
left kidney.

Bladder:

Appears normal for degree of bladder distention. Both ureteral jets
were visualized.
IMPRESSION: 1. No acute sonographic abnormality detected. There is no
hydronephrosis.
2. Slightly increased size of the left kidney with respect to the
right. This is nonspecific and can represent a normal finding,
however renal enlargement can also be seen in the setting of
pyelonephritis. Correlation with urinalysis is recommended.
3. A 0.6 cm hyperechoic nodule in the upper pole the left kidney.
This is favored to represent a subcentimeter angiomyolipoma.
Consider a 3-6 month follow-up ultrasound to confirm stability of
this finding.

## 2020-05-23 IMAGING — DX CHEST  1 VIEW
1 series · 1 of 1 positions shown · non-contrast
Comparison: None.

CLINICAL DATA: Fever, myalgias and headache.

EXAM:
CHEST  1 VIEW

[chest ap]
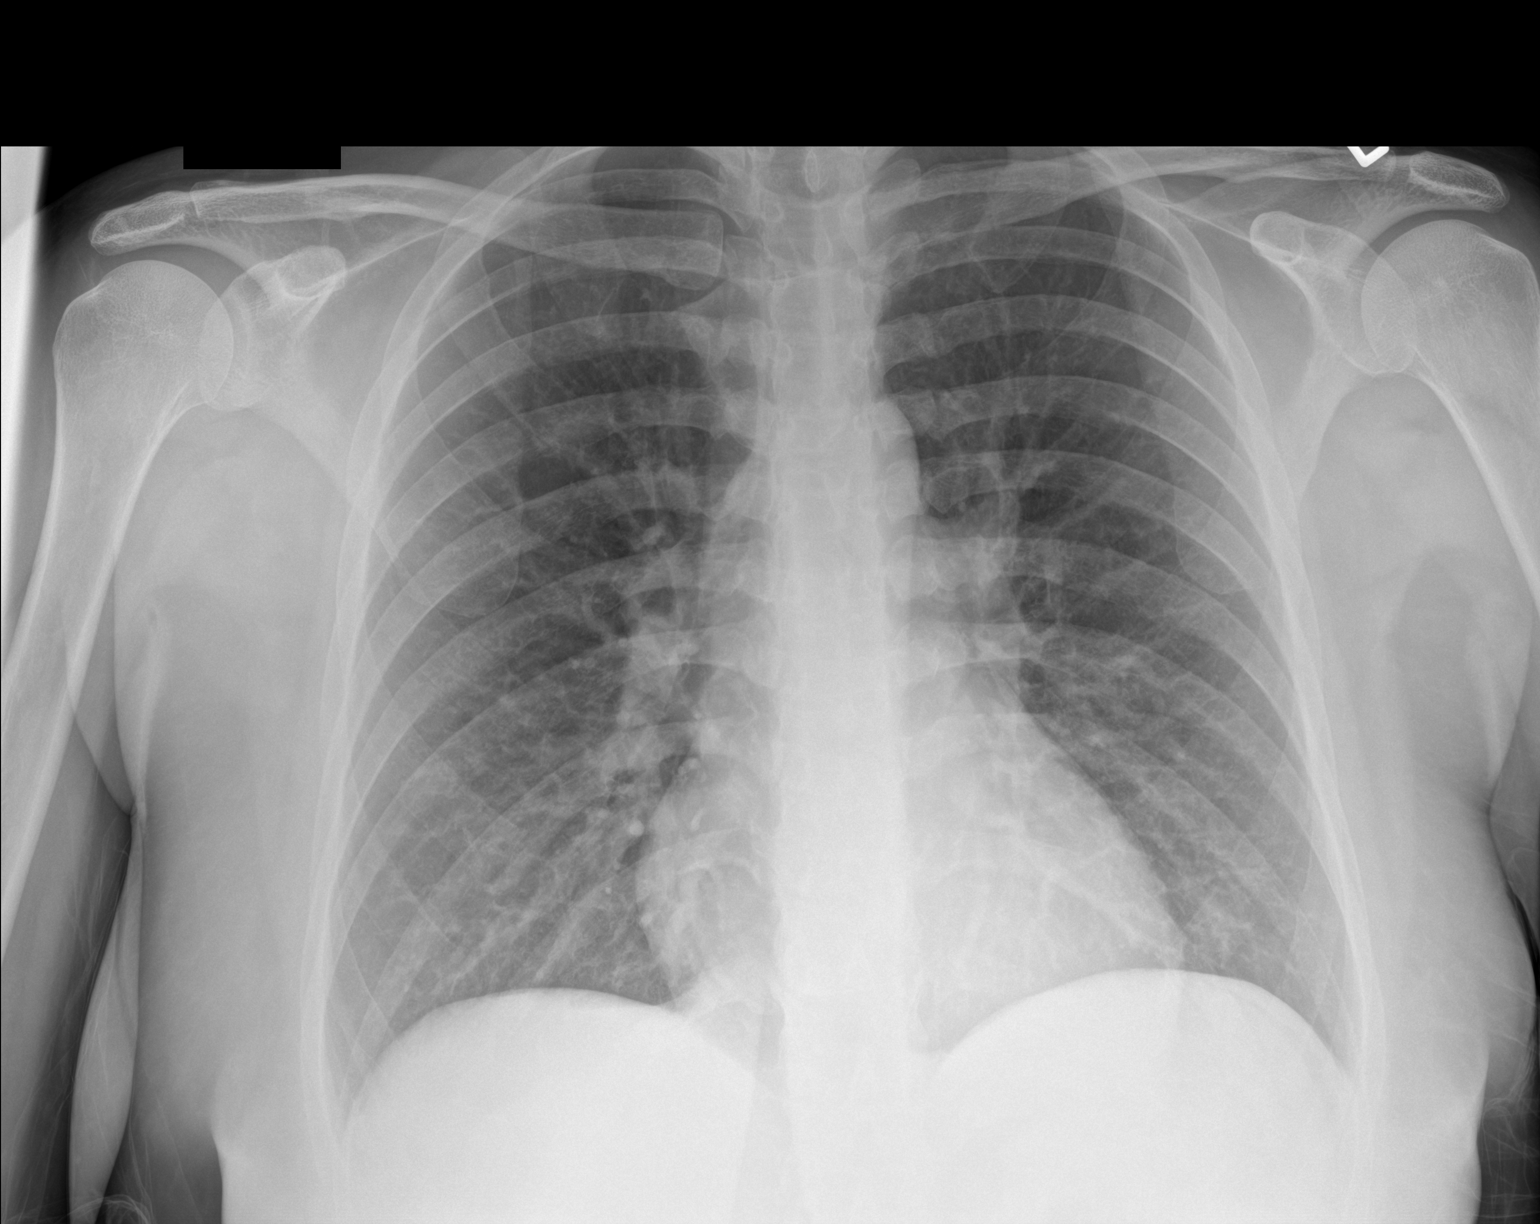

[1 of 1 positions shown; findings below may reference images not displayed]

FINDINGS: The heart size and mediastinal contours are within normal limits.
Both lungs are clear. The visualized skeletal structures are
unremarkable.
IMPRESSION: Normal examination.

## 2020-05-25 ENCOUNTER — Other Ambulatory Visit: Payer: Self-pay

## 2020-05-25 ENCOUNTER — Ambulatory Visit (INDEPENDENT_AMBULATORY_CARE_PROVIDER_SITE_OTHER): Payer: Self-pay | Admitting: Dermatology

## 2020-05-25 DIAGNOSIS — L988 Other specified disorders of the skin and subcutaneous tissue: Secondary | ICD-10-CM

## 2020-05-25 NOTE — Progress Notes (Signed)
   Follow-Up Visit   Subjective  Wanda Bennett is a 34 y.o. female who presents for the following: Facial Elastosis (4 week Botox follow up). Pt pleased with results but would like to add injection of lower eyelids.  The following portions of the chart were reviewed this encounter and updated as appropriate:  Tobacco  Allergies  Meds  Problems  Med Hx  Surg Hx  Fam Hx     Review of Systems:  No other skin or systemic complaints except as noted in HPI or Assessment and Plan.  Objective  Well appearing patient in no apparent distress; mood and affect are within normal limits.  A focused examination was performed including face. Relevant physical exam findings are noted in the Assessment and Plan.  Objective  Bilateral lower eyelid: Rhytides and volume loss.   Images     Assessment & Plan  Elastosis of skin Bilateral lower eyelid  Botox Injection - Bilateral lower eyelid Location: See attached image  Informed consent: Discussed risks (infection, pain, bleeding, bruising, swelling, allergic reaction, paralysis of nearby muscles, eyelid droop, double vision, neck weakness, difficulty breathing, headache, undesirable cosmetic result, and need for additional treatment) and benefits of the procedure, as well as the alternatives.  Informed consent was obtained.  Preparation: The area was cleansed with alcohol.  Procedure Details:  Botox was injected into the dermis with a 30-gauge needle. Pressure applied to any bleeding. Ice packs offered for swelling.  Lot Number:  [id number] Expiration:  08/2022  Total Units Injected:  2.5  Plan: Patient was instructed to remain upright for 4 hours. Patient was instructed to avoid massaging the face and avoid vigorous exercise for the rest of the day. Tylenol may be used for headache.  Allow 2 weeks before returning to clinic for additional dosing as needed. Patient will call for any problems.   Return for Botox in 3-4 months.   I, Ashok Cordia, CMA, am acting as scribe for Sarina Ser, MD .  Documentation: I have reviewed the above documentation for accuracy and completeness, and I agree with the above.  Sarina Ser, MD

## 2020-05-27 ENCOUNTER — Encounter: Payer: Self-pay | Admitting: Dermatology

## 2020-05-29 DIAGNOSIS — Z23 Encounter for immunization: Secondary | ICD-10-CM | POA: Diagnosis not present

## 2020-06-11 DIAGNOSIS — N3000 Acute cystitis without hematuria: Secondary | ICD-10-CM | POA: Diagnosis not present

## 2020-06-24 DIAGNOSIS — E10649 Type 1 diabetes mellitus with hypoglycemia without coma: Secondary | ICD-10-CM | POA: Diagnosis not present

## 2020-07-07 DIAGNOSIS — E10649 Type 1 diabetes mellitus with hypoglycemia without coma: Secondary | ICD-10-CM | POA: Diagnosis not present

## 2020-08-19 ENCOUNTER — Ambulatory Visit (INDEPENDENT_AMBULATORY_CARE_PROVIDER_SITE_OTHER): Payer: 59 | Admitting: Dermatology

## 2020-08-19 ENCOUNTER — Other Ambulatory Visit: Payer: Self-pay

## 2020-08-19 DIAGNOSIS — L905 Scar conditions and fibrosis of skin: Secondary | ICD-10-CM | POA: Diagnosis not present

## 2020-08-19 DIAGNOSIS — Z86018 Personal history of other benign neoplasm: Secondary | ICD-10-CM

## 2020-08-19 DIAGNOSIS — L821 Other seborrheic keratosis: Secondary | ICD-10-CM

## 2020-08-19 DIAGNOSIS — D1800 Hemangioma unspecified site: Secondary | ICD-10-CM | POA: Diagnosis not present

## 2020-08-19 DIAGNOSIS — L814 Other melanin hyperpigmentation: Secondary | ICD-10-CM

## 2020-08-19 DIAGNOSIS — D2272 Melanocytic nevi of left lower limb, including hip: Secondary | ICD-10-CM | POA: Diagnosis not present

## 2020-08-19 DIAGNOSIS — L578 Other skin changes due to chronic exposure to nonionizing radiation: Secondary | ICD-10-CM

## 2020-08-19 DIAGNOSIS — Z1283 Encounter for screening for malignant neoplasm of skin: Secondary | ICD-10-CM

## 2020-08-19 DIAGNOSIS — D229 Melanocytic nevi, unspecified: Secondary | ICD-10-CM

## 2020-08-19 DIAGNOSIS — L988 Other specified disorders of the skin and subcutaneous tissue: Secondary | ICD-10-CM

## 2020-08-19 NOTE — Patient Instructions (Signed)

## 2020-08-19 NOTE — Progress Notes (Signed)
Follow-Up Visit   Subjective  Wanda Bennett is a 34 y.o. female who presents for the following: FBSE and Facial Elastosis.  Patient here for full body skin exam and skin cancer screening. Patient with history of dysplastic nevi and family history of melanoma in patient's sister. She is not aware of anything new or changing today. Patient also here today for Botox injections. She is pleased with last injections.   The following portions of the chart were reviewed this encounter and updated as appropriate:  Tobacco  Allergies  Meds  Problems  Med Hx  Surg Hx  Fam Hx      Review of Systems:  No other skin or systemic complaints except as noted in HPI or Assessment and Plan.  Objective  Well appearing patient in no apparent distress; mood and affect are within normal limits.  A full examination was performed including scalp, head, eyes, ears, nose, lips, neck, chest, axillae, abdomen, back, buttocks, bilateral upper extremities, bilateral lower extremities, hands, feet, fingers, toes, fingernails, and toenails. All findings within normal limits unless otherwise noted below.  Objective  Right Thigh: 0.6cm medium brown thin papule  Objective  Left Upper Back, Low Back: Dyspigmented smooth macule or patch.   Objective  Face: Rhytides and volume loss.   Images     Assessment & Plan  Nevus Right Thigh  Benign-appearing.  Observation.  Call clinic for new or changing lesions.  Recommend daily use of broad spectrum spf 30+ sunscreen to sun-exposed areas.    Scar (2) Left Upper Back; Low Back  History of atypical nevi with regression  Clear. Observe for recurrence. Call clinic for new or changing lesions.  Recommend regular skin exams, daily broad-spectrum spf 30+ sunscreen use, and photoprotection.     Elastosis of skin Face  Botox Injection - Face Location: See attached image  Informed consent: Discussed risks (infection, pain, bleeding, bruising, swelling,  allergic reaction, paralysis of nearby muscles, eyelid droop, double vision, neck weakness, difficulty breathing, headache, undesirable cosmetic result, and need for additional treatment) and benefits of the procedure, as well as the alternatives.  Informed consent was obtained.  Preparation: The area was cleansed with alcohol.  Procedure Details:  Botox was injected into the dermis with a 30-gauge needle. Pressure applied to any bleeding. Ice packs offered for swelling.  Lot Number:  [id number] Expiration:  10/2022  Total Units Injected:  52.5  Plan: Patient was instructed to remain upright for 4 hours. Patient was instructed to avoid massaging the face and avoid vigorous exercise for the rest of the day. Tylenol may be used for headache.  Allow 2 weeks before returning to clinic for additional dosing as needed. Patient will call for any problems.    History of Dysplastic Nevi - Left upper back, left low back. One location showed dysplasia with regression. Pathology reports unavailable - No evidence of recurrence today - Recommend regular full body skin exams - Recommend daily broad spectrum sunscreen SPF 30+ to sun-exposed areas, reapply every 2 hours as needed.  - Call if any new or changing lesions are noted between office visits  Lentigines - Scattered tan macules - Discussed due to sun exposure - Benign, observe - Call for any changes  Seborrheic Keratoses - Stuck-on, waxy, tan-brown papules and plaques  - Discussed benign etiology and prognosis. - Observe - Call for any changes  Melanocytic Nevi - Tan-brown and/or pink-flesh-colored symmetric macules and papules - Benign appearing on exam today - Observation - Call clinic  for new or changing moles - Recommend daily use of broad spectrum spf 30+ sunscreen to sun-exposed areas.   Hemangiomas - Red papules - Discussed benign nature - Observe - Call for any changes  Actinic Damage - Chronic, secondary to cumulative  UV/sun exposure - diffuse scaly erythematous macules with underlying dyspigmentation - Recommend daily broad spectrum sunscreen SPF 30+ to sun-exposed areas, reapply every 2 hours as needed.  - Call for new or changing lesions.  Skin cancer screening performed today.   Return in about 3 months (around 11/19/2020) for Botox, 1 year TBSE.  Graciella Belton, RMA, am acting as scribe for Forest Gleason, MD .  Documentation: I have reviewed the above documentation for accuracy and completeness, and I agree with the above.  Forest Gleason, MD

## 2020-08-26 DIAGNOSIS — E1065 Type 1 diabetes mellitus with hyperglycemia: Secondary | ICD-10-CM | POA: Diagnosis not present

## 2020-09-06 ENCOUNTER — Encounter: Payer: Self-pay | Admitting: Dermatology

## 2020-09-15 DIAGNOSIS — E1069 Type 1 diabetes mellitus with other specified complication: Secondary | ICD-10-CM | POA: Diagnosis not present

## 2020-09-15 DIAGNOSIS — Z23 Encounter for immunization: Secondary | ICD-10-CM | POA: Diagnosis not present

## 2020-09-15 DIAGNOSIS — E785 Hyperlipidemia, unspecified: Secondary | ICD-10-CM | POA: Diagnosis not present

## 2020-09-15 DIAGNOSIS — E10649 Type 1 diabetes mellitus with hypoglycemia without coma: Secondary | ICD-10-CM | POA: Diagnosis not present

## 2020-12-15 ENCOUNTER — Other Ambulatory Visit: Payer: Self-pay

## 2020-12-15 ENCOUNTER — Ambulatory Visit (INDEPENDENT_AMBULATORY_CARE_PROVIDER_SITE_OTHER): Payer: 59 | Admitting: Dermatology

## 2020-12-15 DIAGNOSIS — L988 Other specified disorders of the skin and subcutaneous tissue: Secondary | ICD-10-CM

## 2020-12-15 NOTE — Patient Instructions (Addendum)
Topical retinoid medications like tretinoin/Retin-A, adapalene/Differin, tazarotene/Fabior, and Epiduo/Epiduo Forte can cause dryness and irritation when first started. Only apply a pea-sized amount to the entire affected area. Avoid applying it around the eyes, edges of mouth and creases at the nose. If you experience irritation, use a good moisturizer first and/or apply the medicine less often. If you are doing well with the medicine, you can increase how often you use it until you are applying every night. Be careful with sun protection while using this medication as it can make you sensitive to the sun. This medicine should not be used by pregnant women.   Instructions for [facility] Medications  One or more of your medications was sent to the [facility] mail order compounding pharmacy. You will receive an email from them and can purchase the medicine through that link. It will then be mailed to your home at the address you confirmed. If for any reason you do not receive an email from them, please check your spam folder. If you still do not find the email, please let us know. [facility] phone number is [phone].   For Botox  Remain upright for 4 hours. Avoid massaging the face and avoid vigorous exercise for the rest of the day. Tylenol may be used for headache.  Allow 2 weeks before returning to clinic for additional dosing as needed. Call for any problems.

## 2020-12-15 NOTE — Progress Notes (Signed)
   Follow-Up Visit   Subjective  Wanda Bennett is a 35 y.o. female who presents for the following: Facial Elastosis (Patient is here today for botox. She states she has had botox about 3 - 4 months ago. ).  The following portions of the chart were reviewed this encounter and updated as appropriate:  Tobacco  Allergies  Meds  Problems  Med Hx  Surg Hx  Fam Hx      Objective  Well appearing patient in no apparent distress; mood and affect are within normal limits.  A focused examination was performed including face. Relevant physical exam findings are noted in the Assessment and Plan.  Objective  face: Rhytides and volume loss.   Images    Assessment & Plan  Elastosis of skin face  Will prescribe [facility] Anti-Aging Tretinoin 0.0125%/Niacinamide/Vitamin C/Resveratrol with Hyaluronic Acid. Apply pea sized amount nightly to the entire face. This cannot be used more than 3 months due to risk of exogenous ochronosis (permanent dark spots). The patient was advised this is not covered by insurance since it is made by a compounding pharmacy. They will receive an email to check out and the medication will be mailed to their home.   Topical retinoid medications like tretinoin can cause dryness and irritation when first started. Only apply a pea-sized amount to the entire affected area. Avoid applying it around the eyes, edges of mouth and creases at the nose. If you experience irritation, use a good moisturizer first and/or apply the medicine less often. If you are doing well with the medicine, you can increase how often you use it until you are applying every night. Be careful with sun protection while using this medication as it can make you sensitive to the sun. This medicine should not be used by pregnant women.    Botox Injection - Face Location: See attached image   Informed consent: Discussed risks (infection, pain, bleeding, bruising, swelling, allergic reaction, paralysis  of nearby muscles, eyelid droop, double vision, neck weakness, difficulty breathing, headache, undesirable cosmetic result, and need for additional treatment) and benefits of the procedure, as well as the alternatives.  Informed consent was obtained.   Preparation: The area was cleansed with alcohol.   Procedure Details:  Botox was injected into the dermis with a 30-gauge needle. Pressure applied to any bleeding. Ice packs offered for swelling.   Lot Number:  [id number] Expiration:  10/2022   Total Units Injected:  52.5   Plan: Patient was instructed to remain upright for 4 hours. Patient was instructed to avoid massaging the face and avoid vigorous exercise for the rest of the day. Tylenol may be used for headache.  Allow 2 weeks before returning to clinic for additional dosing as needed. Patient will call for any problems.  Return in about 3 months (around 03/31/2021) for botox .  I, Ruthell Rummage, CMA, am acting as scribe for Forest Gleason, MD.  Documentation: I have reviewed the above documentation for accuracy and completeness, and I agree with the above.  Forest Gleason, MD

## 2020-12-21 ENCOUNTER — Encounter: Payer: Self-pay | Admitting: Dermatology

## 2021-03-09 ENCOUNTER — Ambulatory Visit (INDEPENDENT_AMBULATORY_CARE_PROVIDER_SITE_OTHER): Payer: 59 | Admitting: Dermatology

## 2021-03-09 ENCOUNTER — Other Ambulatory Visit: Payer: Self-pay

## 2021-03-09 DIAGNOSIS — L700 Acne vulgaris: Secondary | ICD-10-CM | POA: Diagnosis not present

## 2021-03-09 DIAGNOSIS — L719 Rosacea, unspecified: Secondary | ICD-10-CM | POA: Diagnosis not present

## 2021-03-09 DIAGNOSIS — L988 Other specified disorders of the skin and subcutaneous tissue: Secondary | ICD-10-CM | POA: Diagnosis not present

## 2021-03-09 MED ORDER — ADAPALENE 0.1 % EX CREA: TOPICAL_CREAM | Freq: Every day | CUTANEOUS | 11 refills | Status: DC

## 2021-03-09 MED ORDER — DOXYCYCLINE HYCLATE 20 MG PO TABS: 20.0000 mg | ORAL_TABLET | Freq: Two times a day (BID) | ORAL | 2 refills | Status: DC

## 2021-03-09 NOTE — Progress Notes (Signed)
Follow-Up Visit   Subjective  Wanda Bennett is a 35 y.o. female who presents for the following: Facial Elastosis (Patient here for 3 month botox. Patient states she rx tretinion states  her skin has been a mess since using. Patient has tried every other day, every day, and every 2 days. She is still seeing some deep cystic acne and  her skin is so oily. States it has caused break outs on clear area and causing some scarring. ).  Patient was using 0.0125 % Niacinamide/vitamin C/ resveratrol with hyaluronic acid  The following portions of the chart were reviewed this encounter and updated as appropriate:  Tobacco  Allergies  Meds  Problems  Med Hx  Surg Hx  Fam Hx      Objective  Well appearing patient in no apparent distress; mood and affect are within normal limits.  A focused examination was performed including face, chin, . Relevant physical exam findings are noted in the Assessment and Plan.  Objective  Face: Trace open comedones at face     Objective  Head - Anterior (Face): Rhytides and volume loss.   Images    Objective  face: Scattered inflammatory papule at lower face particularly chin  Assessment & Plan  Acne vulgaris Face  Chronic condition with duration over one year. Condition is bothersome to patient. Currently flared.  Patient is bothered with breakouts since using 0.0125 % niacinamide/vitamin C/ resveratrol with hyaluronic acid so will d/c tretinoin. Once clear, will restart differin cream.  Start doxycycline 20 mg twice a day with food until clear.   Doxycycline should be taken with food to prevent nausea. Do not lay down for 30 minutes after taking. Be cautious with sun exposure and use good sun protection while on this medication. Pregnant women should not take this medication.    doxycycline (PERIOSTAT) 20 MG tablet - Face  Elastosis of skin Head - Anterior (Face)  52.5 units of botox was injected today   [id number] 02/2023  Botox  Injection - Head - Anterior (Face) Location: face  Informed consent: Discussed risks (infection, pain, bleeding, bruising, swelling, allergic reaction, paralysis of nearby muscles, eyelid droop, double vision, neck weakness, difficulty breathing, headache, undesirable cosmetic result, and need for additional treatment) and benefits of the procedure, as well as the alternatives.  Informed consent was obtained.  Preparation: The area was cleansed with alcohol.  Procedure Details:  Botox was injected into the dermis with a 30-gauge needle. Pressure applied to any bleeding. Ice packs offered for swelling.  Lot Number: [id number] Expiration:  02/2023  Total Units Injected:  52.5  Plan: Patient was instructed to remain upright for 4 hours. Patient was instructed to avoid massaging the face and avoid vigorous exercise for the rest of the day. Tylenol may be used for headache.  Allow 2 weeks before returning to clinic for additional dosing as needed. Patient will call for any problems.   Rosacea face  Favor perioral dermatitis with background rosacea  Rosacea is a chronic progressive skin condition usually affecting the face of adults, causing redness and/or acne bumps. It is treatable but not curable. It sometimes affects the eyes (ocular rosacea) as well. It may respond to topical and/or systemic medication and can flare with stress, sun exposure, alcohol, exercise and some foods.  Daily application of broad spectrum spf 30+ sunscreen to face is recommended to reduce flares.  Zilxi 1.5 % foam apply to face nightly sample given in office [id number] jul 2022  Start Zilxi apply  very thin layer to face at night   Start doxycycline 20 mg twice a day with food.   Doxycycline should be taken with food to prevent nausea. Do not lay down for 30 minutes after taking. Be cautious with sun exposure and use good sun protection while on this medication. Pregnant women should not take this medication.    Recommend taking Heliocare sun protection supplement daily in sunny weather for additional sun protection. For maximum protection on the sunniest days, you can take up to 2 capsules of regular Heliocare OR take 1 capsule of Heliocare Ultra. For prolonged exposure (such as a full day in the sun), you can repeat your dose of the supplement 4 hours after your first dose.   doxycycline (PERIOSTAT) 20 MG tablet - face  Return in about 14 weeks (around 06/15/2021) for rosacea follow up and botox .   I, Ruthell Rummage, CMA, am acting as scribe for Forest Gleason, MD.  Documentation: I have reviewed the above documentation for accuracy and completeness, and I agree with the above.  Forest Gleason, MD

## 2021-03-09 NOTE — Patient Instructions (Addendum)
Doxycycline should be taken with food to prevent nausea. Do not lay down for 30 minutes after taking. Be cautious with sun exposure and use good sun protection while on this medication. Pregnant women should not take this medication.   Topical retinoid medications like tretinoin/Retin-A, adapalene/Differin, tazarotene/Fabior, and Epiduo/Epiduo Forte can cause dryness and irritation when first started. Only apply a pea-sized amount to the entire affected area. Avoid applying it around the eyes, edges of mouth and creases at the nose. If you experience irritation, use a good moisturizer first and/or apply the medicine less often. If you are doing well with the medicine, you can increase how often you use it until you are applying every night. Be careful with sun protection while using this medication as it can make you sensitive to the sun. This medicine should not be used by pregnant women.   Recommend taking Heliocare sun protection supplement daily in sunny weather for additional sun protection. For maximum protection on the sunniest days, you can take up to 2 capsules of regular Heliocare OR take 1 capsule of Heliocare Ultra. For prolonged exposure (such as a full day in the sun), you can repeat your dose of the supplement 4 hours after your first dose. Heliocare can be purchased at [facility] or at [email or web address].   Recommend daily broad spectrum sunscreen SPF 30+ to sun-exposed areas, reapply every 2 hours as needed. Call for new or changing lesions.  Staying in the shade or wearing long sleeves, sun glasses (UVA+UVB protection) and wide brim hats (4-inch brim around the entire circumference of the hat) are also recommended for sun protection.         If you have any questions or concerns for your doctor, please call our main line at [phone] and press option 4 to reach your doctor's medical assistant. If no one answers, please leave a voicemail as directed and we will return your  call as soon as possible. Messages left after 4 pm will be answered the following business day.   You may also send [place] a message via [place]. We typically respond to MyChart messages within 1-2 business days.  For prescription refills, please ask your pharmacy to contact our office. Our fax number is [phone].  If you have an urgent issue when the clinic is closed that cannot wait until the next business day, you can page your doctor at the number below.    Please note that while we do our best to be available for urgent issues outside of office hours, we are not available 24/7.   If you have an urgent issue and are unable to reach [place], you may choose to seek medical care at your doctor's office, retail clinic, urgent care center, or emergency room.  If you have a medical emergency, please immediately call 911 or go to the emergency department.  Pager Numbers  - Dr. Nehemiah Massed: [phone]  - Dr. Laurence Ferrari: [phone]  - Dr. Nicole Kindred: [phone]  In the event of inclement weather, please call our main line at [phone] for an update on the status of any delays or closures.  Dermatology Medication Tips: Please keep the boxes that topical medications come in in order to help keep track of the instructions about where and how to use these. Pharmacies typically print the medication instructions only on the boxes and not directly on the medication tubes.   If your medication is too expensive, please contact our office at [phone] option 4 or send [place] a message  through [place].   We are unable to tell what your co-pay for medications will be in advance as this is different depending on your insurance coverage. However, we may be able to find a substitute medication at lower cost or fill out paperwork to get insurance to cover a needed medication.   If a prior authorization is required to get your medication covered by your insurance company, please allow [place] 1-2 business days to  complete this process.  Drug prices often vary depending on where the prescription is filled and some pharmacies may offer cheaper prices.  The website www.goodrx.com contains coupons for medications through different pharmacies. The prices here do not account for what the cost may be with help from insurance (it may be cheaper with your insurance), but the website can give you the price if you did not use any insurance.  - You can print the associated coupon and take it with your prescription to the pharmacy.  - You may also stop by our office during regular business hours and pick up a GoodRx coupon card.  - If you need your prescription sent electronically to a different pharmacy, notify our office through [facility] or by phone at [phone] option 4.

## 2021-03-13 ENCOUNTER — Encounter: Payer: Self-pay | Admitting: Dermatology

## 2021-03-13 MED ORDER — ADAPALENE 0.3 % EX GEL: 1.0000 "application " | Freq: Every day | CUTANEOUS | 11 refills | Status: DC

## 2021-03-23 DIAGNOSIS — E785 Hyperlipidemia, unspecified: Secondary | ICD-10-CM | POA: Diagnosis not present

## 2021-03-23 DIAGNOSIS — E1069 Type 1 diabetes mellitus with other specified complication: Secondary | ICD-10-CM | POA: Diagnosis not present

## 2021-03-23 DIAGNOSIS — E10649 Type 1 diabetes mellitus with hypoglycemia without coma: Secondary | ICD-10-CM | POA: Diagnosis not present

## 2021-04-23 DIAGNOSIS — E1065 Type 1 diabetes mellitus with hyperglycemia: Secondary | ICD-10-CM | POA: Diagnosis not present

## 2021-06-15 ENCOUNTER — Other Ambulatory Visit: Payer: Self-pay

## 2021-06-15 ENCOUNTER — Encounter: Payer: Self-pay | Admitting: Dermatology

## 2021-06-15 ENCOUNTER — Ambulatory Visit (INDEPENDENT_AMBULATORY_CARE_PROVIDER_SITE_OTHER): Payer: 59 | Admitting: Dermatology

## 2021-06-15 DIAGNOSIS — L700 Acne vulgaris: Secondary | ICD-10-CM

## 2021-06-15 DIAGNOSIS — L988 Other specified disorders of the skin and subcutaneous tissue: Secondary | ICD-10-CM

## 2021-06-15 DIAGNOSIS — L719 Rosacea, unspecified: Secondary | ICD-10-CM

## 2021-06-15 MED ORDER — DOXYCYCLINE HYCLATE 20 MG PO TABS: 20.0000 mg | ORAL_TABLET | Freq: Two times a day (BID) | ORAL | 2 refills | Status: DC

## 2021-06-15 NOTE — Progress Notes (Signed)
Follow-Up Visit   Subjective  Wanda Bennett is a 35 y.o. female who presents for the following: Facial Elastosis (Patient here today for botox injections. ).  She notes acne is doing better since using the doxycycline better but she has not restarted tretinoin due to concern about flare.   The following portions of the chart were reviewed this encounter and updated as appropriate:   Tobacco  Allergies  Meds  Problems  Med Hx  Surg Hx  Fam Hx      Review of Systems:  No other skin or systemic complaints except as noted in HPI or Assessment and Plan.  Objective  Well appearing patient in no apparent distress; mood and affect are within normal limits.  A focused examination was performed including face, chest. Relevant physical exam findings are noted in the Assessment and Plan.  Head - Anterior (Face) Rhytides and volume loss.      Head - Anterior (Face) Trace open comedones   Assessment & Plan  Elastosis of skin Head - Anterior (Face)  Increased to 1.5 units lateral forehead bilaterally. Patient to call if she still notes more movement/wrinkling than she would like in 2 weeks  Filling material injection - Head - Anterior (Face) Location: See attached image  Informed consent: Discussed risks (infection, pain, bleeding, bruising, swelling, allergic reaction, paralysis of nearby muscles, eyelid droop, double vision, neck weakness, difficulty breathing, headache, undesirable cosmetic result, and need for additional treatment) and benefits of the procedure, as well as the alternatives.  Informed consent was obtained.  Preparation: The area was cleansed with alcohol.  Procedure Details:  Botox was injected into the dermis with a 30-gauge needle. Pressure applied to any bleeding. Ice packs offered for swelling.  Lot Number:  [id number] Expiration:  04/2023  Total Units Injected:  53.5  Plan: Tylenol may be used for headache.  Allow 2 weeks before returning to clinic for  additional dosing as needed. Patient will call for any problems.   Acne vulgaris Head - Anterior (Face)  Chronic condition with duration over one year. Condition is bothersome to patient. Not currently at goal.  Restart tretinoin 0.0125% cream one night per week and increased as tolerated  Restart doxycycline '20mg'$  twice daily with food as needed for flares  Doxycycline should be taken with food to prevent nausea. Do not lay down for 30 minutes after taking. Be cautious with sun exposure and use good sun protection while on this medication. Pregnant women should not take this medication.   Topical retinoid medications like tretinoin/Retin-A, adapalene/Differin, tazarotene/Fabior, and Epiduo/Epiduo Forte can cause dryness and irritation when first started. Only apply a pea-sized amount to the entire affected area. Avoid applying it around the eyes, edges of mouth and creases at the nose. If you experience irritation, use a good moisturizer first and/or apply the medicine less often. If you are doing well with the medicine, you can increase how often you use it until you are applying every night. Be careful with sun protection while using this medication as it can make you sensitive to the sun. This medicine should not be used by pregnant women.   If she does not tolerate the tretinoin, would switch to prescription adapalene 0.3% gel nightly as tolerated    Rosacea  Related Medications doxycycline (PERIOSTAT) 20 MG tablet Take 1 tablet (20 mg total) by mouth 2 (two) times daily. Take with food  Return in about 3 months (around 09/14/2021) for Acne, Botox.  Graciella Belton, RMA, am  acting as scribe for Forest Gleason, MD .  Documentation: I have reviewed the above documentation for accuracy and completeness, and I agree with the above.  Forest Gleason, MD

## 2021-06-15 NOTE — Patient Instructions (Addendum)
Topical retinoid medications like tretinoin/Retin-A, adapalene/Differin, tazarotene/Fabior, and Epiduo/Epiduo Forte can cause dryness and irritation when first started. Only apply a pea-sized amount to the entire affected area. Avoid applying it around the eyes, edges of mouth and creases at the nose. If you experience irritation, use a good moisturizer first and/or apply the medicine less often. If you are doing well with the medicine, you can increase how often you use it until you are applying every night. Be careful with sun protection while using this medication as it can make you sensitive to the sun. This medicine should not be used by pregnant women.   Doxycycline should be taken with food to prevent nausea. Do not lay down for 30 minutes after taking. Be cautious with sun exposure and use good sun protection while on this medication. Pregnant women should not take this medication.   If you have any questions or concerns for your doctor, please call our main line at [phone] and press option 4 to reach your doctor's medical assistant. If no one answers, please leave a voicemail as directed and we will return your call as soon as possible. Messages left after 4 pm will be answered the following business day.   You may also send [place] a message via [place]. We typically respond to MyChart messages within 1-2 business days.  For prescription refills, please ask your pharmacy to contact our office. Our fax number is [phone].  If you have an urgent issue when the clinic is closed that cannot wait until the next business day, you can page your doctor at the number below.    Please note that while we do our best to be available for urgent issues outside of office hours, we are not available 24/7.   If you have an urgent issue and are unable to reach [place], you may choose to seek medical care at your doctor's office, retail clinic, urgent care center, or emergency room.  If you have a medical  emergency, please immediately call 911 or go to the emergency department.  Pager Numbers  - Dr. Nehemiah Massed: [phone]  - Dr. Laurence Ferrari: [phone]  - Dr. Nicole Kindred: [phone]  In the event of inclement weather, please call our main line at [phone] for an update on the status of any delays or closures.  Dermatology Medication Tips: Please keep the boxes that topical medications come in in order to help keep track of the instructions about where and how to use these. Pharmacies typically print the medication instructions only on the boxes and not directly on the medication tubes.   If your medication is too expensive, please contact our office at [phone] option 4 or send [place] a message through [place].   We are unable to tell what your co-pay for medications will be in advance as this is different depending on your insurance coverage. However, we may be able to find a substitute medication at lower cost or fill out paperwork to get insurance to cover a needed medication.   If a prior authorization is required to get your medication covered by your insurance company, please allow [place] 1-2 business days to complete this process.  Drug prices often vary depending on where the prescription is filled and some pharmacies may offer cheaper prices.  The website www.goodrx.com contains coupons for medications through different pharmacies. The prices here do not account for what the cost may be with help from insurance (it may be cheaper with your insurance), but the website can give you  the price if you did not use any insurance.  - You can print the associated coupon and take it with your prescription to the pharmacy.  - You may also stop by our office during regular business hours and pick up a GoodRx coupon card.  - If you need your prescription sent electronically to a different pharmacy, notify our office through [facility] or by phone at [phone] option 4.

## 2021-08-17 DIAGNOSIS — Z209 Contact with and (suspected) exposure to unspecified communicable disease: Secondary | ICD-10-CM | POA: Diagnosis not present

## 2021-08-17 DIAGNOSIS — R509 Fever, unspecified: Secondary | ICD-10-CM | POA: Diagnosis not present

## 2021-08-25 ENCOUNTER — Other Ambulatory Visit: Payer: Self-pay

## 2021-08-25 ENCOUNTER — Ambulatory Visit (INDEPENDENT_AMBULATORY_CARE_PROVIDER_SITE_OTHER): Payer: Self-pay | Admitting: Dermatology

## 2021-08-25 DIAGNOSIS — L700 Acne vulgaris: Secondary | ICD-10-CM

## 2021-08-25 DIAGNOSIS — L988 Other specified disorders of the skin and subcutaneous tissue: Secondary | ICD-10-CM

## 2021-08-25 NOTE — Progress Notes (Signed)
   Follow-Up Visit   Subjective  Wanda Bennett is a 35 y.o. 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 She used Doxycycline 20mg  po BID to help calm acne flare, but stopped it once controled again.).  The following portions of the chart were reviewed this encounter and updated as appropriate:   Tobacco  Allergies  Meds  Problems  Med Hx  Surg Hx  Fam Hx      Review of Systems:  No other skin or systemic complaints except as noted in HPI or Assessment and Plan.  Objective  Well appearing patient in no apparent distress; mood and affect are within normal limits.  A focused examination was performed including the face. Relevant physical exam findings are noted in the Assessment and Plan.  Face Rhytides and volume loss.       Assessment & Plan  Elastosis of skin Face  Would like to increase the strength of her retinoid but she unfortunately had an acne flare when she switched to the skin medicinals compounded tretinoin.  She is tolerating adapalene 0.3% fine.  Fine to continue the adapalene but if she would like to try 1 more time to move to a stronger tretinoin, recommend starting Doxycycline 20mg  po BID a few days before starting Altreno samples and continue for one month to help prevent flare. Will contact patient when samples of Altreno are available.   Continue Adapalene 0.3% gel QHS. Topical retinoid medications like tretinoin/Retin-A, adapalene/Differin, tazarotene/Fabior, and Epiduo/Epiduo Forte can cause dryness and irritation when first started. Only apply a pea-sized amount to the entire affected area. Avoid applying it around the eyes, edges of mouth and creases at the nose. If you experience irritation, use a good moisturizer first and/or apply the medicine less often. If you are doing well with the medicine, you can increase  how often you use it until you are applying every night. Be careful with sun protection while using this medication as it can make you sensitive to the sun. This medicine should not be used by pregnant women.    Botox Injection - Face Location: See attached image  Informed consent: Discussed risks (infection, pain, bleeding, bruising, swelling, allergic reaction, paralysis of nearby muscles, eyelid droop, double vision, neck weakness, difficulty breathing, headache, undesirable cosmetic result, and need for additional treatment) and benefits of the procedure, as well as the alternatives.  Informed consent was obtained.  Preparation: The area was cleansed with alcohol.  Procedure Details:  Botox was injected into the dermis with a 30-gauge needle. Pressure applied to any bleeding. Ice packs offered for swelling.  Lot Number:  [id number] Expiration:  05/2023  Total Units Injected:  53.5  Plan: Tylenol may be used for headache.  Allow 2 weeks before returning to clinic for additional dosing as needed. Patient will call for any problems.   Return in about 3 months (around 11/25/2021) for Botox follow up .  Luther Redo, CMA, am acting as scribe for Forest Gleason, MD .  Documentation: I have reviewed the above documentation for accuracy and completeness, and I agree with the above.  Forest Gleason, MD

## 2021-08-25 NOTE — Patient Instructions (Signed)

## 2021-08-30 ENCOUNTER — Encounter: Payer: Self-pay | Admitting: Dermatology

## 2021-09-07 DIAGNOSIS — Z0000 Encounter for general adult medical examination without abnormal findings: Secondary | ICD-10-CM | POA: Diagnosis not present

## 2021-09-07 DIAGNOSIS — E785 Hyperlipidemia, unspecified: Secondary | ICD-10-CM | POA: Diagnosis not present

## 2021-09-07 DIAGNOSIS — E1069 Type 1 diabetes mellitus with other specified complication: Secondary | ICD-10-CM | POA: Diagnosis not present

## 2021-09-07 DIAGNOSIS — E10649 Type 1 diabetes mellitus with hypoglycemia without coma: Secondary | ICD-10-CM | POA: Diagnosis not present

## 2021-09-12 DIAGNOSIS — Z111 Encounter for screening for respiratory tuberculosis: Secondary | ICD-10-CM | POA: Diagnosis not present

## 2021-09-20 DIAGNOSIS — E1069 Type 1 diabetes mellitus with other specified complication: Secondary | ICD-10-CM | POA: Diagnosis not present

## 2021-09-20 DIAGNOSIS — E10649 Type 1 diabetes mellitus with hypoglycemia without coma: Secondary | ICD-10-CM | POA: Diagnosis not present

## 2021-09-20 DIAGNOSIS — E785 Hyperlipidemia, unspecified: Secondary | ICD-10-CM | POA: Diagnosis not present

## 2021-09-28 ENCOUNTER — Ambulatory Visit: Payer: 59 | Admitting: Dermatology

## 2021-12-08 ENCOUNTER — Ambulatory Visit (INDEPENDENT_AMBULATORY_CARE_PROVIDER_SITE_OTHER): Payer: Self-pay | Admitting: Dermatology

## 2021-12-08 ENCOUNTER — Other Ambulatory Visit: Payer: Self-pay

## 2021-12-08 DIAGNOSIS — L988 Other specified disorders of the skin and subcutaneous tissue: Secondary | ICD-10-CM

## 2021-12-08 NOTE — Patient Instructions (Signed)

## 2021-12-08 NOTE — Progress Notes (Signed)
° °  Follow-Up Visit   Subjective  Wanda Bennett is a 36 y.o. female who presents for the following: Facial Elastosis (Patient is here today for Botox injections. She has noticed a crease at the bridge of her nose and she would like to discuss treatment options. ).  The following portions of the chart were reviewed this encounter and updated as appropriate:   Tobacco   Allergies   Meds   Problems   Med Hx   Surg Hx   Fam Hx       Review of Systems:  No other skin or systemic complaints except as noted in HPI or Assessment and Plan.  Objective  Well appearing patient in no apparent distress; mood and affect are within normal limits.  A focused examination was performed including the face. Relevant physical exam findings are noted in the Assessment and Plan.  Face Rhytides and volume loss.        Assessment & Plan  Elastosis of skin Face  Botox 58.5 units injected as marked:  - Frown complex 26 u - B/L crow's feet 10 u each for a total of 20 u - Under eye 1.25 units each for a total of 2.5 u  - Forehead 6 u - add Bunny line treatment 2 u each side for a total of 4 u  Could also consider treating depressor supercilii in future if she does not get complete smoothing at that area after treatment   Botox Injection - Face Location: See attached image  Informed consent: Discussed risks (infection, pain, bleeding, bruising, swelling, allergic reaction, paralysis of nearby muscles, eyelid droop, double vision, neck weakness, difficulty breathing, headache, undesirable cosmetic result, and need for additional treatment) and benefits of the procedure, as well as the alternatives.  Informed consent was obtained.  Preparation: The area was cleansed with alcohol.  Procedure Details:  Botox was injected into the dermis with a 30-gauge needle. Pressure applied to any bleeding. Ice packs offered for swelling.  Lot Number:  [id number] Expiration:  12/2023  Total Units Injected:   53.5  Plan: Tylenol may be used for headache.  Allow 2 weeks before returning to clinic for additional dosing as needed. Patient will call for any problems.    Return in about 3 months (around 03/07/2022).  Luther Redo, CMA, am acting as scribe for Forest Gleason, MD .   Documentation: I have reviewed the above documentation for accuracy and completeness, and I agree with the above.  Forest Gleason, MD

## 2021-12-12 ENCOUNTER — Encounter: Payer: Self-pay | Admitting: Dermatology

## 2021-12-26 ENCOUNTER — Telehealth: Payer: Self-pay

## 2021-12-26 NOTE — Telephone Encounter (Signed)
Called patient and left voicemail. Dr. Laurence Ferrari wants her to come pick up samples of Altreno when these come in. I thought they were here today but we received [place]raz[place]. I did leave patient detailed msg that I will call her back within the next few days with the correct samples that Dr. Laurence Ferrari wants her to try. aw ?

## 2022-03-11 DIAGNOSIS — J329 Chronic sinusitis, unspecified: Secondary | ICD-10-CM | POA: Diagnosis not present

## 2022-04-20 ENCOUNTER — Ambulatory Visit (INDEPENDENT_AMBULATORY_CARE_PROVIDER_SITE_OTHER): Payer: Self-pay | Admitting: Dermatology

## 2022-04-20 ENCOUNTER — Encounter: Payer: Self-pay | Admitting: Dermatology

## 2022-04-20 DIAGNOSIS — L988 Other specified disorders of the skin and subcutaneous tissue: Secondary | ICD-10-CM

## 2022-04-20 NOTE — Patient Instructions (Signed)
Due to recent changes in healthcare laws, you may see results of your pathology and/or laboratory studies on MyChart before the doctors have had a chance to review them. We understand that in some cases there may be results that are confusing or concerning to you. Please understand that not all results are received at the same time and often the doctors may need to interpret multiple results in order to provide you with the best plan of care or course of treatment. Therefore, we ask that you please give us 2 business days to thoroughly review all your results before contacting the office for clarification. Should we see a critical lab result, you will be contacted sooner.   If You Need Anything After Your Visit  If you have any questions or concerns for your doctor, please call our main line at [phone] and press option 4 to reach your doctor's medical assistant. If no one answers, please leave a voicemail as directed and we will return your call as soon as possible. Messages left after 4 pm will be answered the following business day.   You may also send us a message via MyChart. We typically respond to MyChart messages within 1-2 business days.  For prescription refills, please ask your pharmacy to contact our office. Our fax number is [phone].  If you have an urgent issue when the clinic is closed that cannot wait until the next business day, you can page your doctor at the number below.    Please note that while we do our best to be available for urgent issues outside of office hours, we are not available 24/7.   If you have an urgent issue and are unable to reach us, you may choose to seek medical care at your doctor's office, retail clinic, urgent care center, or emergency room.  If you have a medical emergency, please immediately call 911 or go to the emergency department.  Pager Numbers  - Dr. Kowalski: [phone]  - Dr. Moye: [phone]  - Dr. Stewart:  [phone]  In the event of inclement weather, please call our main line at [phone] for an update on the status of any delays or closures.  Dermatology Medication Tips: Please keep the boxes that topical medications come in in order to help keep track of the instructions about where and how to use these. Pharmacies typically print the medication instructions only on the boxes and not directly on the medication tubes.   If your medication is too expensive, please contact our office at [phone] option 4 or send us a message through MyChart.   We are unable to tell what your co-pay for medications will be in advance as this is different depending on your insurance coverage. However, we may be able to find a substitute medication at lower cost or fill out paperwork to get insurance to cover a needed medication.   If a prior authorization is required to get your medication covered by your insurance company, please allow us 1-2 business days to complete this process.  Drug prices often vary depending on where the prescription is filled and some pharmacies may offer cheaper prices.  The website www.goodrx.com contains coupons for medications through different pharmacies. The prices here do not account for what the cost may be with help from insurance (it may be cheaper with your insurance), but the website can give you the price if you did not use any insurance.  - You can print the associated coupon and take it with   your prescription to the pharmacy.  - You may also stop by our office during regular business hours and pick up a GoodRx coupon card.  - If you need your prescription sent electronically to a different pharmacy, notify our office through [place] MyChart or by phone at [phone] option 4.     Si Usted Necesita Algo Despus de Su Visita  Tambin puede enviarnos un mensaje a travs de MyChart. Por lo general respondemos a los mensajes de MyChart en el transcurso de 1 a 2  das hbiles.  Para renovar recetas, por favor pida a su farmacia que se ponga en contacto con nuestra oficina. Nuestro nmero de fax es el [phone].  Si tiene un asunto urgente cuando la clnica est cerrada y que no puede esperar hasta el siguiente da hbil, puede llamar/localizar a su doctor(a) al nmero que aparece a continuacin.   Por favor, tenga en cuenta que aunque hacemos todo lo posible para estar disponibles para asuntos urgentes fuera del horario de oficina, no estamos disponibles las 24 horas del da, los 7 das de la semana.   Si tiene un problema urgente y no puede comunicarse con nosotros, puede optar por buscar atencin mdica  en el consultorio de su doctor(a), en una clnica privada, en un centro de atencin urgente o en una sala de emergencias.  Si tiene una emergencia mdica, por favor llame inmediatamente al 911 o vaya a la sala de emergencias.  Nmeros de bper  - Dr. Kowalski: [phone]  - Dra. Moye: [phone]  - Dra. Stewart: [phone]  En caso de inclemencias del tiempo, por favor llame a nuestra lnea principal al [phone] para una actualizacin sobre el estado de cualquier retraso o cierre.  Consejos para la medicacin en dermatologa: Por favor, guarde las cajas en las que vienen los medicamentos de uso tpico para ayudarle a seguir las instrucciones sobre dnde y cmo usarlos. Las farmacias generalmente imprimen las instrucciones del medicamento slo en las cajas y no directamente en los tubos del medicamento.   Si su medicamento es muy caro, por favor, pngase en contacto con nuestra oficina llamando al [phone] y presione la opcin 4 o envenos un mensaje a travs de MyChart.   No podemos decirle cul ser su copago por los medicamentos por adelantado ya que esto es diferente dependiendo de la cobertura de su seguro. Sin embargo, es posible que podamos encontrar un medicamento sustituto a menor costo o llenar un formulario para que el  seguro cubra el medicamento que se considera necesario.   Si se requiere una autorizacin previa para que su compaa de seguros cubra su medicamento, por favor permtanos de 1 a 2 das hbiles para completar este proceso.  Los precios de los medicamentos varan con frecuencia dependiendo del lugar de dnde se surte la receta y alguna farmacias pueden ofrecer precios ms baratos.  El sitio web www.goodrx.com tiene cupones para medicamentos de diferentes farmacias. Los precios aqu no tienen en cuenta lo que podra costar con la ayuda del seguro (puede ser ms barato con su seguro), pero el sitio web puede darle el precio si no utiliz ningn seguro.  - Puede imprimir el cupn correspondiente y llevarlo con su receta a la farmacia.  - Tambin puede pasar por nuestra oficina durante el horario de atencin regular y recoger una tarjeta de cupones de GoodRx.  - Si necesita que su receta se enve electrnicamente a una farmacia diferente, informe a nuestra oficina a travs de MyChart de [place] [facility]   o por telfono llamando al [phone] y presione la opcin 4.  

## 2022-04-20 NOTE — Progress Notes (Signed)
   Follow-Up Visit   Subjective  Wanda Bennett is a 36 y.o. female who presents for the following: Facial Elastosis (Here for Botox).    The following portions of the chart were reviewed this encounter and updated as appropriate:  Tobacco  Allergies  Meds  Problems  Med Hx  Surg Hx  Fam Hx      Review of Systems: No other skin or systemic complaints except as noted in HPI or Assessment and Plan.   Objective  Well appearing patient in no apparent distress; mood and affect are within normal limits.  A focused examination was performed including face. Relevant physical exam findings are noted in the Assessment and Plan.  face Rhytides and volume loss.       Assessment & Plan  Elastosis of skin face  Botox 56 units injected as marked:  - Frown complex 26 u - B/L crow's feet 10 u each for a total of 20 u  - Forehead 6 u - Bunny line treatment 2 u each side for a total of 4 u  Botox Injection - face Location: See attached image  Informed consent: Discussed risks (infection, pain, bleeding, bruising, swelling, allergic reaction, paralysis of nearby muscles, eyelid droop, double vision, neck weakness, difficulty breathing, headache, undesirable cosmetic result, and need for additional treatment) and benefits of the procedure, as well as the alternatives.  Informed consent was obtained.  Preparation: The area was cleansed with alcohol.  Procedure Details:  Botox was injected into the dermis with a 30-gauge needle. Pressure applied to any bleeding. Ice packs offered for swelling.  Lot Number:  [id number] Expiration:  02/2024  Total Units Injected:  56  Plan: Patient was instructed to remain upright for 4 hours. Patient was instructed to avoid massaging the face and avoid vigorous exercise for the rest of the day. Tylenol may be used for headache.  Allow 2 weeks before returning to clinic for additional dosing as needed. Patient will call for any problems.    Return in  about 3 months (around 07/21/2022) for Botox.  I, Emelia Salisbury, CMA, am acting as scribe for Forest Gleason, MD.  Documentation: I have reviewed the above documentation for accuracy and completeness, and I agree with the above.  Forest Gleason, MD

## 2022-05-01 ENCOUNTER — Encounter: Payer: Self-pay | Admitting: Dermatology

## 2022-05-17 ENCOUNTER — Ambulatory Visit: Payer: 59 | Admitting: Dermatology

## 2022-05-25 ENCOUNTER — Ambulatory Visit (INDEPENDENT_AMBULATORY_CARE_PROVIDER_SITE_OTHER): Payer: 59 | Admitting: Dermatology

## 2022-05-25 DIAGNOSIS — Z1283 Encounter for screening for malignant neoplasm of skin: Secondary | ICD-10-CM | POA: Diagnosis not present

## 2022-05-25 DIAGNOSIS — L578 Other skin changes due to chronic exposure to nonionizing radiation: Secondary | ICD-10-CM | POA: Diagnosis not present

## 2022-05-25 DIAGNOSIS — D225 Melanocytic nevi of trunk: Secondary | ICD-10-CM | POA: Diagnosis not present

## 2022-05-25 DIAGNOSIS — Z86018 Personal history of other benign neoplasm: Secondary | ICD-10-CM

## 2022-05-25 DIAGNOSIS — D229 Melanocytic nevi, unspecified: Secondary | ICD-10-CM

## 2022-05-25 DIAGNOSIS — L814 Other melanin hyperpigmentation: Secondary | ICD-10-CM

## 2022-05-25 DIAGNOSIS — L720 Epidermal cyst: Secondary | ICD-10-CM | POA: Diagnosis not present

## 2022-05-25 DIAGNOSIS — D239 Other benign neoplasm of skin, unspecified: Secondary | ICD-10-CM

## 2022-05-25 DIAGNOSIS — D489 Neoplasm of uncertain behavior, unspecified: Secondary | ICD-10-CM

## 2022-05-25 DIAGNOSIS — L918 Other hypertrophic disorders of the skin: Secondary | ICD-10-CM

## 2022-05-25 DIAGNOSIS — L821 Other seborrheic keratosis: Secondary | ICD-10-CM

## 2022-05-25 DIAGNOSIS — D1800 Hemangioma unspecified site: Secondary | ICD-10-CM

## 2022-05-25 HISTORY — DX: Other benign neoplasm of skin, unspecified: D23.9

## 2022-05-25 MED ORDER — ADAPALENE 0.3 % EX GEL: CUTANEOUS | 11 refills | Status: DC

## 2022-05-25 NOTE — Progress Notes (Signed)
Follow-Up Visit   Subjective  Wanda Bennett is a 36 y.o. female who presents for the following: Annual Exam (1 yr tbse. Hx of dysplastic nevus. ). The patient presents for Total-Body Skin Exam (TBSE) for skin cancer screening and mole check.  The patient has spots, moles and lesions to be evaluated, some may be new or changing and the patient has concerns that these could be cancer.  The following portions of the chart were reviewed this encounter and updated as appropriate:  Allergies  Meds     Review of Systems: No other skin or systemic complaints except as noted in HPI or Assessment and Plan.  Objective  Well appearing patient in no apparent distress; mood and affect are within normal limits.  A full examination was performed including scalp, head, eyes, ears, nose, lips, neck, chest, axillae, abdomen, back, buttocks, bilateral upper extremities, bilateral lower extremities, hands, feet, fingers, toes, fingernails, and toenails. All findings within normal limits unless otherwise noted below.  right lower quadrant abdomen 0.6 cm slightly irregular brown macule        Head - Anterior (Face) Smooth white papule(s).    Assessment & Plan  Lentigines - Scattered tan macules - Due to sun exposure - Benign-appearing, observe - Recommend daily broad spectrum sunscreen SPF 30+ to sun-exposed areas, reapply every 2 hours as needed. - Call for any changes  Seborrheic Keratoses - Stuck-on, waxy, tan-brown papules and/or plaques  - Benign-appearing - Discussed benign etiology and prognosis. - Observe - Call for any changes  Melanocytic Nevi - Tan-brown and/or pink-flesh-colored symmetric macules and papules - Benign appearing on exam today - Observation - Call clinic for new or changing moles - Recommend daily use of broad spectrum spf 30+ sunscreen to sun-exposed areas.   Hemangiomas - Red papules - Discussed benign nature - Observe - Call for any  changes  Acrochordons (Skin Tags) - Fleshy, skin-colored pedunculated papules - Benign appearing.  - Observe. - If desired, they can be removed with an in office procedure that is not covered by insurance. - Please call the clinic if you notice any new or changing lesions.  Actinic Damage - Chronic condition, secondary to cumulative UV/sun exposure - diffuse scaly erythematous macules with underlying dyspigmentation - Recommend daily broad spectrum sunscreen SPF 30+ to sun-exposed areas, reapply every 2 hours as needed.  - Staying in the shade or wearing long sleeves, sun glasses (UVA+UVB protection) and wide brim hats (4-inch brim around the entire circumference of the hat) are also recommended for sun protection.  - Call for new or changing lesions.  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 One location showed dysplasia with regression. Pathology reports unavailable - No evidence of recurrence today - Recommend regular full body skin exams - Recommend daily broad spectrum sunscreen SPF 30+ to sun-exposed areas, reapply every 2 hours as needed.  - Call if any new or changing lesions are noted between office visits  Neoplasm of uncertain behavior right lower quadrant abdomen Epidermal / dermal shaving  Lesion diameter (cm):  0.6 Informed consent: discussed and consent obtained   Timeout: patient name, date of birth, surgical site, and procedure verified   Procedure prep:  Patient was prepped and draped in usual sterile fashion Prep type:  Isopropyl alcohol Anesthesia: the lesion was anesthetized in a standard fashion   Anesthetic:  1% lidocaine w/ epinephrine 1-100,000 buffered w/ 8.4% NaHCO3 Instrument used: flexible razor blade   Hemostasis  achieved with: pressure, aluminum chloride and electrodesiccation   Outcome: patient tolerated procedure well   Post-procedure details: sterile dressing applied and wound care instructions given    Dressing type: bandage and petrolatum    Specimen 1 - Surgical pathology Differential Diagnosis: irregular nevus vs dysplasia Check Margins: No Irregular nevus vs dysplastic   [place] Head - Anterior (Face) Discussed treatment if bothersome Chronic and persistent condition with duration or expected duration over one year. Condition is symptomatic / bothersome to patient. Not to goal. Patient would like to treat topically  Start adapalene 0.3 % gel - apply topically to face qhs for acne/milia   Topical retinoid medications like tretinoin/Retin-A, adapalene/Differin, tazarotene/Fabior, and Epiduo/Epiduo Forte can cause dryness and irritation when first started. Only apply a pea-sized amount to the entire affected area. Avoid applying it around the eyes, edges of mouth and creases at the nose. If you experience irritation, use a good moisturizer first and/or apply the medicine less often. If you are doing well with the medicine, you can increase how often you use it until you are applying every night. Be careful with sun protection while using this medication as it can make you sensitive to the sun. This medicine should not be used by pregnant women.  ' Adapalene (DIFFERIN) 0.3 % gel - Head - Anterior (Face) Use pea sized amount to face qhs for acne/milia  Skin cancer screening performed today. Return in about 1 year (around 05/26/2023) for TBSE. IRuthell Rummage, CMA, am acting as scribe for Sarina Ser, MD. Documentation: I have reviewed the above documentation for accuracy and completeness, and I agree with the above.  Sarina Ser, MD

## 2022-05-25 NOTE — Patient Instructions (Addendum)
Biopsy Wound Care Instructions  Leave the original bandage on for 24 hours if possible.  If the bandage becomes soaked or soiled before that time, it is OK to remove it and examine the wound.  A small amount of post-operative bleeding is normal.  If excessive bleeding occurs, remove the bandage, place gauze over the site and apply continuous pressure (no peeking) over the area for 30 minutes. If this does not work, please call our clinic as soon as possible or page your doctor if it is after hours.   Once a day, cleanse the wound with soap and water. It is fine to shower. If a thick crust develops you may use a Q-tip dipped into dilute hydrogen peroxide (mix 1:1 with water) to dissolve it.  Hydrogen peroxide can slow the healing process, so use it only as needed.    After washing, apply petroleum jelly (Vaseline) or an antibiotic ointment if your doctor prescribed one for you, followed by a bandage.    For best healing, the wound should be covered with a layer of ointment at all times. If you are not able to keep the area covered with a bandage to hold the ointment in place, this may mean re-applying the ointment several times a day.  Continue this wound care until the wound has healed and is no longer open.   Itching and mild discomfort is normal during the healing process. However, if you develop pain or severe itching, please call our office.   If you have any discomfort, you can take Tylenol (acetaminophen) or ibuprofen as directed on the bottle. (Please do not take these if you have an allergy to them or cannot take them for another reason).  Some redness, tenderness and white or yellow material in the wound is normal healing.  If the area becomes very sore and red, or develops a thick yellow-green material (pus), it may be infected; please notify us.    If you have stitches, return to clinic as directed to have the stitches removed. You will continue wound care for 2-3 days after the stitches  are removed.   Wound healing continues for up to one year following surgery. It is not unusual to experience pain in the scar from time to time during the interval.  If the pain becomes severe or the scar thickens, you should notify the office.    A slight amount of redness in a scar is expected for the first six months.  After six months, the redness will fade and the scar will soften and fade.  The color difference becomes less noticeable with time.  If there are any problems, return for a post-op surgery check at your earliest convenience.  To improve the appearance of the scar, you can use silicone scar gel, cream, or sheets (such as Mederma or Serica) every night for up to one year. These are available over the counter (without a prescription).  Please call our office at [phone] for any questions or concerns.       Melanoma ABCDEs  Melanoma is the most dangerous type of skin cancer, and is the leading cause of death from skin disease.  You are more likely to develop melanoma if you: Have light-colored skin, light-colored eyes, or red or blond hair Spend a lot of time in the sun Tan regularly, either outdoors or in a tanning bed Have had blistering sunburns, especially during childhood Have a close family member who has had a melanoma Have atypical moles   or large birthmarks  Early detection of melanoma is key since treatment is typically straightforward and cure rates are extremely high if we catch it early.   The first sign of melanoma is often a change in a mole or a new dark spot.  The ABCDE system is a way of remembering the signs of melanoma.  A for asymmetry:  The two halves do not match. B for border:  The edges of the growth are irregular. C for color:  A mixture of colors are present instead of an even brown color. D for diameter:  Melanomas are usually (but not always) greater than 6mm - the size of a pencil eraser. E for evolution:  The spot keeps changing in  size, shape, and color.  Please check your skin once per month between visits. You can use a small mirror in front and a large mirror behind you to keep an eye on the back side or your body.   If you see any new or changing lesions before your next follow-up, please call to schedule a visit.  Please continue daily skin protection including broad spectrum sunscreen SPF 30+ to sun-exposed areas, reapplying every 2 hours as needed when you're outdoors.   Staying in the shade or wearing long sleeves, sun glasses (UVA+UVB protection) and wide brim hats (4-inch brim around the entire circumference of the hat) are also recommended for sun protection.       Due to recent changes in healthcare laws, you may see results of your pathology and/or laboratory studies on MyChart before the doctors have had a chance to review them. We understand that in some cases there may be results that are confusing or concerning to you. Please understand that not all results are received at the same time and often the doctors may need to interpret multiple results in order to provide you with the best plan of care or course of treatment. Therefore, we ask that you please give us 2 business days to thoroughly review all your results before contacting the office for clarification. Should we see a critical lab result, you will be contacted sooner.   If You Need Anything After Your Visit  If you have any questions or concerns for your doctor, please call our main line at [phone] and press option 4 to reach your doctor's medical assistant. If no one answers, please leave a voicemail as directed and we will return your call as soon as possible. Messages left after 4 pm will be answered the following business day.   You may also send us a message via MyChart. We typically respond to MyChart messages within 1-2 business days.  For prescription refills, please ask your pharmacy to contact our office. Our fax number is  [phone].  If you have an urgent issue when the clinic is closed that cannot wait until the next business day, you can page your doctor at the number below.    Please note that while we do our best to be available for urgent issues outside of office hours, we are not available 24/7.   If you have an urgent issue and are unable to reach us, you may choose to seek medical care at your doctor's office, retail clinic, urgent care center, or emergency room.  If you have a medical emergency, please immediately call 911 or go to the emergency department.  Pager Numbers  - Dr. Kowalski: [phone]  - Dr. Moye: [phone]  - Dr. Stewart: [phone]  In the   event of inclement weather, please call our main line at [phone] for an update on the status of any delays or closures.  Dermatology Medication Tips: Please keep the boxes that topical medications come in in order to help keep track of the instructions about where and how to use these. Pharmacies typically print the medication instructions only on the boxes and not directly on the medication tubes.   If your medication is too expensive, please contact our office at [phone] option 4 or send us a message through MyChart.   We are unable to tell what your co-pay for medications will be in advance as this is different depending on your insurance coverage. However, we may be able to find a substitute medication at lower cost or fill out paperwork to get insurance to cover a needed medication.   If a prior authorization is required to get your medication covered by your insurance company, please allow us 1-2 business days to complete this process.  Drug prices often vary depending on where the prescription is filled and some pharmacies may offer cheaper prices.  The website www.goodrx.com contains coupons for medications through different pharmacies. The prices here do not account for what the cost may be with help from  insurance (it may be cheaper with your insurance), but the website can give you the price if you did not use any insurance.  - You can print the associated coupon and take it with your prescription to the pharmacy.  - You may also stop by our office during regular business hours and pick up a GoodRx coupon card.  - If you need your prescription sent electronically to a different pharmacy, notify our office through [facility] MyChart or by phone at [phone] option 4.     Si Usted Necesita Algo Despus de Su Visita  Tambin puede enviarnos un mensaje a travs de MyChart. Por lo general respondemos a los mensajes de MyChart en el transcurso de 1 a 2 das hbiles.  Para renovar recetas, por favor pida a su farmacia que se ponga en contacto con nuestra oficina. Nuestro nmero de fax es el [phone].  Si tiene un asunto urgente cuando la clnica est cerrada y que no puede esperar hasta el siguiente da hbil, puede llamar/localizar a su doctor(a) al nmero que aparece a continuacin.   Por favor, tenga en cuenta que aunque hacemos todo lo posible para estar disponibles para asuntos urgentes fuera del horario de oficina, no estamos disponibles las 24 horas del da, los 7 das de la semana.   Si tiene un problema urgente y no puede comunicarse con nosotros, puede optar por buscar atencin mdica  en el consultorio de su doctor(a), en una clnica privada, en un centro de atencin urgente o en una sala de emergencias.  Si tiene una emergencia mdica, por favor llame inmediatamente al 911 o vaya a la sala de emergencias.  Nmeros de bper  - Dr. Kowalski: [phone]  - Dra. Moye: [phone]  - Dra. Stewart: [phone]  En caso de inclemencias del tiempo, por favor llame a nuestra lnea principal al [phone] para una actualizacin sobre el estado de cualquier retraso o cierre.  Consejos para la medicacin en dermatologa: Por favor, guarde las cajas en las que vienen los  medicamentos de uso tpico para ayudarle a seguir las instrucciones sobre dnde y cmo usarlos. Las farmacias generalmente imprimen las instrucciones del medicamento slo en las cajas y no directamente en los tubos del medicamento.   Si su   medicamento es muy caro, por favor, pngase en contacto con nuestra oficina llamando al [phone] y presione la opcin 4 o envenos un mensaje a travs de MyChart.   No podemos decirle cul ser su copago por los medicamentos por adelantado ya que esto es diferente dependiendo de la cobertura de su seguro. Sin embargo, es posible que podamos encontrar un medicamento sustituto a menor costo o llenar un formulario para que el seguro cubra el medicamento que se considera necesario.   Si se requiere una autorizacin previa para que su compaa de seguros cubra su medicamento, por favor permtanos de 1 a 2 das hbiles para completar este proceso.  Los precios de los medicamentos varan con frecuencia dependiendo del lugar de dnde se surte la receta y alguna farmacias pueden ofrecer precios ms baratos.  El sitio web www.goodrx.com tiene cupones para medicamentos de diferentes farmacias. Los precios aqu no tienen en cuenta lo que podra costar con la ayuda del seguro (puede ser ms barato con su seguro), pero el sitio web puede darle el precio si no utiliz ningn seguro.  - Puede imprimir el cupn correspondiente y llevarlo con su receta a la farmacia.  - Tambin puede pasar por nuestra oficina durante el horario de atencin regular y recoger una tarjeta de cupones de GoodRx.  - Si necesita que su receta se enve electrnicamente a una farmacia diferente, informe a nuestra oficina a travs de MyChart de [facility] o por telfono llamando al [phone] y presione la opcin 4.  

## 2022-05-29 ENCOUNTER — Telehealth: Payer: Self-pay

## 2022-05-29 NOTE — Telephone Encounter (Signed)
Called patient. Discussed pathology results. [place] for yearly TBSE, to recheck. Patient voiced understanding. RTC sooner if see recurrence of color. JP

## 2022-05-29 NOTE — Telephone Encounter (Signed)
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

## 2022-07-27 ENCOUNTER — Ambulatory Visit (INDEPENDENT_AMBULATORY_CARE_PROVIDER_SITE_OTHER): Payer: Self-pay | Admitting: Dermatology

## 2022-07-27 DIAGNOSIS — L988 Other specified disorders of the skin and subcutaneous tissue: Secondary | ICD-10-CM

## 2022-07-27 NOTE — Progress Notes (Signed)
   Follow-Up Visit   Subjective  Wanda Bennett is a 36 y.o. female who presents for the following: Facial Elastosis (Patient here today for Botox injections. ).   The following portions of the chart were reviewed this encounter and updated as appropriate:   Tobacco  Allergies  Meds  Problems  Med Hx  Surg Hx  Fam Hx      Review of Systems:  No other skin or systemic complaints except as noted in HPI or Assessment and Plan.  Objective  Well appearing patient in no apparent distress; mood and affect are within normal limits.  A focused examination was performed including face. Relevant physical exam findings are noted in the Assessment and Plan.  face Rhytides and volume loss.        Assessment & Plan  Elastosis of skin face  Botox 56 units - Crows Feet 10 units each side Bunny Lines 2 units each side Frown Complex 26 units Forehead 6 units  Filling material injection - face Location: See attached image  Informed consent: Discussed risks (infection, pain, bleeding, bruising, swelling, allergic reaction, paralysis of nearby muscles, eyelid droop, double vision, neck weakness, difficulty breathing, headache, undesirable cosmetic result, and need for additional treatment) and benefits of the procedure, as well as the alternatives.  Informed consent was obtained.  Preparation: The area was cleansed with alcohol.  Procedure Details:  Botox was injected into the dermis with a 30-gauge needle. Pressure applied to any bleeding. Ice packs offered for swelling.  Lot Number:  [id number] Expiration:  09/2024  Total Units Injected:  56  Plan: Tylenol may be used for headache.  Allow 2 weeks before returning to clinic for additional dosing as needed. Patient will call for any problems.    Return in about 3 months (around 10/27/2022) for Botox.  Graciella Belton, RMA, am acting as scribe for Forest Gleason, MD .  Documentation: I have reviewed the above documentation for  accuracy and completeness, and I agree with the above.  Forest Gleason, MD

## 2022-07-27 NOTE — Patient Instructions (Signed)
Due to recent changes in healthcare laws, you may see results of your pathology and/or laboratory studies on MyChart before the doctors have had a chance to review them. We understand that in some cases there may be results that are confusing or concerning to you. Please understand that not all results are received at the same time and often the doctors may need to interpret multiple results in order to provide you with the best plan of care or course of treatment. Therefore, we ask that you please give us 2 business days to thoroughly review all your results before contacting the office for clarification. Should we see a critical lab result, you will be contacted sooner.   If You Need Anything After Your Visit  If you have any questions or concerns for your doctor, please call our main line at [phone] and press option 4 to reach your doctor's medical assistant. If no one answers, please leave a voicemail as directed and we will return your call as soon as possible. Messages left after 4 pm will be answered the following business day.   You may also send us a message via MyChart. We typically respond to MyChart messages within 1-2 business days.  For prescription refills, please ask your pharmacy to contact our office. Our fax number is [phone].  If you have an urgent issue when the clinic is closed that cannot wait until the next business day, you can page your doctor at the number below.    Please note that while we do our best to be available for urgent issues outside of office hours, we are not available 24/7.   If you have an urgent issue and are unable to reach us, you may choose to seek medical care at your doctor's office, retail clinic, urgent care center, or emergency room.  If you have a medical emergency, please immediately call 911 or go to the emergency department.  Pager Numbers  - Dr. Kowalski: [phone]  - Dr. Moye: [phone]  - Dr. Stewart:  [phone]  In the event of inclement weather, please call our main line at [phone] for an update on the status of any delays or closures.  Dermatology Medication Tips: Please keep the boxes that topical medications come in in order to help keep track of the instructions about where and how to use these. Pharmacies typically print the medication instructions only on the boxes and not directly on the medication tubes.   If your medication is too expensive, please contact our office at [phone] option 4 or send us a message through MyChart.   We are unable to tell what your co-pay for medications will be in advance as this is different depending on your insurance coverage. However, we may be able to find a substitute medication at lower cost or fill out paperwork to get insurance to cover a needed medication.   If a prior authorization is required to get your medication covered by your insurance company, please allow us 1-2 business days to complete this process.  Drug prices often vary depending on where the prescription is filled and some pharmacies may offer cheaper prices.  The website www.goodrx.com contains coupons for medications through different pharmacies. The prices here do not account for what the cost may be with help from insurance (it may be cheaper with your insurance), but the website can give you the price if you did not use any insurance.  - You can print the associated coupon and take it with   your prescription to the pharmacy.  - You may also stop by our office during regular business hours and pick up a GoodRx coupon card.  - If you need your prescription sent electronically to a different pharmacy, notify our office through [facility] MyChart or by phone at [phone] option 4.     Si Usted Necesita Algo Despus de Su Visita  Tambin puede enviarnos un mensaje a travs de MyChart. Por lo general respondemos a los mensajes de MyChart en el transcurso de 1 a 2  das hbiles.  Para renovar recetas, por favor pida a su farmacia que se ponga en contacto con nuestra oficina. Nuestro nmero de fax es el [phone].  Si tiene un asunto urgente cuando la clnica est cerrada y que no puede esperar hasta el siguiente da hbil, puede llamar/localizar a su doctor(a) al nmero que aparece a continuacin.   Por favor, tenga en cuenta que aunque hacemos todo lo posible para estar disponibles para asuntos urgentes fuera del horario de oficina, no estamos disponibles las 24 horas del da, los 7 das de la semana.   Si tiene un problema urgente y no puede comunicarse con nosotros, puede optar por buscar atencin mdica  en el consultorio de su doctor(a), en una clnica privada, en un centro de atencin urgente o en una sala de emergencias.  Si tiene una emergencia mdica, por favor llame inmediatamente al 911 o vaya a la sala de emergencias.  Nmeros de bper  - Dr. Kowalski: [phone]  - Dra. Moye: [phone]  - Dra. Stewart: [phone]  En caso de inclemencias del tiempo, por favor llame a nuestra lnea principal al [phone] para una actualizacin sobre el estado de cualquier retraso o cierre.  Consejos para la medicacin en dermatologa: Por favor, guarde las cajas en las que vienen los medicamentos de uso tpico para ayudarle a seguir las instrucciones sobre dnde y cmo usarlos. Las farmacias generalmente imprimen las instrucciones del medicamento slo en las cajas y no directamente en los tubos del medicamento.   Si su medicamento es muy caro, por favor, pngase en contacto con nuestra oficina llamando al [phone] y presione la opcin 4 o envenos un mensaje a travs de MyChart.   No podemos decirle cul ser su copago por los medicamentos por adelantado ya que esto es diferente dependiendo de la cobertura de su seguro. Sin embargo, es posible que podamos encontrar un medicamento sustituto a menor costo o llenar un formulario para que el  seguro cubra el medicamento que se considera necesario.   Si se requiere una autorizacin previa para que su compaa de seguros cubra su medicamento, por favor permtanos de 1 a 2 das hbiles para completar este proceso.  Los precios de los medicamentos varan con frecuencia dependiendo del lugar de dnde se surte la receta y alguna farmacias pueden ofrecer precios ms baratos.  El sitio web www.goodrx.com tiene cupones para medicamentos de diferentes farmacias. Los precios aqu no tienen en cuenta lo que podra costar con la ayuda del seguro (puede ser ms barato con su seguro), pero el sitio web puede darle el precio si no utiliz ningn seguro.  - Puede imprimir el cupn correspondiente y llevarlo con su receta a la farmacia.  - Tambin puede pasar por nuestra oficina durante el horario de atencin regular y recoger una tarjeta de cupones de GoodRx.  - Si necesita que su receta se enve electrnicamente a una farmacia diferente, informe a nuestra oficina a travs de MyChart de [facility]   o por telfono llamando al [phone] y presione la opcin 4.  

## 2022-07-28 ENCOUNTER — Encounter: Payer: Self-pay | Admitting: Dermatology

## 2022-08-25 ENCOUNTER — Other Ambulatory Visit: Payer: Self-pay | Admitting: Dermatology

## 2022-08-25 DIAGNOSIS — L719 Rosacea, unspecified: Secondary | ICD-10-CM

## 2022-11-02 ENCOUNTER — Ambulatory Visit (INDEPENDENT_AMBULATORY_CARE_PROVIDER_SITE_OTHER): Payer: 59 | Admitting: Dermatology

## 2022-11-02 ENCOUNTER — Encounter: Payer: Self-pay | Admitting: Dermatology

## 2022-11-02 DIAGNOSIS — L988 Other specified disorders of the skin and subcutaneous tissue: Secondary | ICD-10-CM

## 2022-11-02 NOTE — Patient Instructions (Addendum)
Due to recent changes in healthcare laws, you may see results of your pathology and/or laboratory studies on MyChart before the doctors have had a chance to review them. We understand that in some cases there may be results that are confusing or concerning to you. Please understand that not all results are received at the same time and often the doctors may need to interpret multiple results in order to provide you with the best plan of care or course of treatment. Therefore, we ask that you please give us 2 business days to thoroughly review all your results before contacting the office for clarification. Should we see a critical lab result, you will be contacted sooner.   If You Need Anything After Your Visit  If you have any questions or concerns for your doctor, please call our main line at [phone] and press option 4 to reach your doctor's medical assistant. If no one answers, please leave a voicemail as directed and we will return your call as soon as possible. Messages left after 4 pm will be answered the following business day.   You may also send us a message via MyChart. We typically respond to MyChart messages within 1-2 business days.  For prescription refills, please ask your pharmacy to contact our office. Our fax number is [phone].  If you have an urgent issue when the clinic is closed that cannot wait until the next business day, you can page your doctor at the number below.    Please note that while we do our best to be available for urgent issues outside of office hours, we are not available 24/7.   If you have an urgent issue and are unable to reach us, you may choose to seek medical care at your doctor's office, retail clinic, urgent care center, or emergency room.  If you have a medical emergency, please immediately call 911 or go to the emergency department.  Pager Numbers  - Dr. Kowalski: [phone]  - Dr. Moye: [phone]  - Dr. Stewart:  [phone]  In the event of inclement weather, please call our main line at [phone] for an update on the status of any delays or closures.  Dermatology Medication Tips: Please keep the boxes that topical medications come in in order to help keep track of the instructions about where and how to use these. Pharmacies typically print the medication instructions only on the boxes and not directly on the medication tubes.   If your medication is too expensive, please contact our office at [phone] option 4 or send us a message through MyChart.   We are unable to tell what your co-pay for medications will be in advance as this is different depending on your insurance coverage. However, we may be able to find a substitute medication at lower cost or fill out paperwork to get insurance to cover a needed medication.   If a prior authorization is required to get your medication covered by your insurance company, please allow us 1-2 business days to complete this process.  Drug prices often vary depending on where the prescription is filled and some pharmacies may offer cheaper prices.  The website www.goodrx.com contains coupons for medications through different pharmacies. The prices here do not account for what the cost may be with help from insurance (it may be cheaper with your insurance), but the website can give you the price if you did not use any insurance.  - You can print the associated coupon and take it with   your prescription to the pharmacy.  - You may also stop by our office during regular business hours and pick up a GoodRx coupon card.  - If you need your prescription sent electronically to a different pharmacy, notify our office through [facility] MyChart or by phone at [phone] option 4.     Si Usted Necesita Algo Despus de Su Visita  Tambin puede enviarnos un mensaje a travs de MyChart. Por lo general respondemos a los mensajes de MyChart en el transcurso de 1 a 2  das hbiles.  Para renovar recetas, por favor pida a su farmacia que se ponga en contacto con nuestra oficina. Nuestro nmero de fax es el [phone].  Si tiene un asunto urgente cuando la clnica est cerrada y que no puede esperar hasta el siguiente da hbil, puede llamar/localizar a su doctor(a) al nmero que aparece a continuacin.   Por favor, tenga en cuenta que aunque hacemos todo lo posible para estar disponibles para asuntos urgentes fuera del horario de oficina, no estamos disponibles las 24 horas del da, los 7 das de la semana.   Si tiene un problema urgente y no puede comunicarse con nosotros, puede optar por buscar atencin mdica  en el consultorio de su doctor(a), en una clnica privada, en un centro de atencin urgente o en una sala de emergencias.  Si tiene una emergencia mdica, por favor llame inmediatamente al 911 o vaya a la sala de emergencias.  Nmeros de bper  - Dr. Kowalski: [phone]  - Dra. Moye: [phone]  - Dra. Stewart: [phone]  En caso de inclemencias del tiempo, por favor llame a nuestra lnea principal al [phone] para una actualizacin sobre el estado de cualquier retraso o cierre.  Consejos para la medicacin en dermatologa: Por favor, guarde las cajas en las que vienen los medicamentos de uso tpico para ayudarle a seguir las instrucciones sobre dnde y cmo usarlos. Las farmacias generalmente imprimen las instrucciones del medicamento slo en las cajas y no directamente en los tubos del medicamento.   Si su medicamento es muy caro, por favor, pngase en contacto con nuestra oficina llamando al [phone] y presione la opcin 4 o envenos un mensaje a travs de MyChart.   No podemos decirle cul ser su copago por los medicamentos por adelantado ya que esto es diferente dependiendo de la cobertura de su seguro. Sin embargo, es posible que podamos encontrar un medicamento sustituto a menor costo o llenar un formulario para que el  seguro cubra el medicamento que se considera necesario.   Si se requiere una autorizacin previa para que su compaa de seguros cubra su medicamento, por favor permtanos de 1 a 2 das hbiles para completar este proceso.  Los precios de los medicamentos varan con frecuencia dependiendo del lugar de dnde se surte la receta y alguna farmacias pueden ofrecer precios ms baratos.  El sitio web www.goodrx.com tiene cupones para medicamentos de diferentes farmacias. Los precios aqu no tienen en cuenta lo que podra costar con la ayuda del seguro (puede ser ms barato con su seguro), pero el sitio web puede darle el precio si no utiliz ningn seguro.  - Puede imprimir el cupn correspondiente y llevarlo con su receta a la farmacia.  - Tambin puede pasar por nuestra oficina durante el horario de atencin regular y recoger una tarjeta de cupones de GoodRx.  - Si necesita que su receta se enve electrnicamente a una farmacia diferente, informe a nuestra oficina a travs de MyChart de [facility]   o por telfono llamando al [phone] y presione la opcin 4.  

## 2022-11-02 NOTE — Progress Notes (Signed)
   Follow-Up Visit   Subjective  Wanda Bennett is a 37 y.o. 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  The following portions of the chart were reviewed this encounter and updated as appropriate:   Tobacco  Allergies  Meds  Problems  Med Hx  Surg Hx  Fam Hx      Review of Systems:  No other skin or systemic complaints except as noted in HPI or Assessment and Plan.  Objective  Well appearing patient in no apparent distress; mood and affect are within normal limits.  A focused examination was performed including face. Relevant physical exam findings are noted in the Assessment and Plan.  Head - Anterior (Face) Rhytides and volume loss.     Assessment & Plan  Elastosis of skin Head - Anterior (Face)  Botox 56 units injected today to: - Crows Feet 10 units each side, total of 20 units - Bunny Lines 2 units each side, total of 4 units - Frown Complex 26 units - Forehead 6 units  Botox Injection - Head - Anterior (Face) Location: frown complex, forehead, crows feet, bunny lines  Informed consent: Discussed risks (infection, pain, bleeding, bruising, swelling, allergic reaction, paralysis of nearby muscles, eyelid droop, double vision, neck weakness, difficulty breathing, headache, undesirable cosmetic result, and need for additional treatment) and benefits of the procedure, as well as the alternatives.  Informed consent was obtained.  Preparation: The area was cleansed with alcohol.  Procedure Details:  Botox was injected into the dermis with a 30-gauge needle. Pressure applied to any bleeding. Ice packs offered for swelling.  Lot Number:  [id number] Expiration:  03/26  Total Units Injected:  56  Plan: Tylenol may be used for headache.  Allow 2 weeks before returning to clinic for additional dosing as needed. Patient will call for any problems.    Return for 3-49mBotox.  IGraciella Belton RMA, am acting as scribe for  VForest Gleason MD .  Documentation: I have reviewed the above documentation for accuracy and completeness, and I agree with the above.  VForest Gleason MD

## 2023-02-01 ENCOUNTER — Ambulatory Visit (INDEPENDENT_AMBULATORY_CARE_PROVIDER_SITE_OTHER): Payer: Self-pay | Admitting: Dermatology

## 2023-02-01 ENCOUNTER — Ambulatory Visit: Payer: Self-pay | Admitting: Dermatology

## 2023-02-01 VITALS — BP 112/75 | HR 87

## 2023-02-01 DIAGNOSIS — L988 Other specified disorders of the skin and subcutaneous tissue: Secondary | ICD-10-CM

## 2023-02-01 NOTE — Progress Notes (Signed)
   Follow-Up Visit   Subjective  Wanda Bennett is a 37 y.o. female who presents for the following: Botox for facial elastosis  The following portions of the chart were reviewed this encounter and updated as appropriate: medications, allergies, medical history  Review of Systems:  No other skin or systemic complaints except as noted in HPI or Assessment and Plan.  Objective  Well appearing patient in no apparent distress; mood and affect are within normal limits.  A focused examination was performed of the face.  Relevant physical exam findings are noted in the Assessment and Plan.    Assessment & Plan    Facial Elastosis  Botox 56 units injected today to: - Crows Feet 10 units each side, total of 20 units - Bunny Lines 2 units each side, total of 4 units - Frown Complex 26 units - Forehead 6 units  Location: See attached image  Informed consent: Discussed risks (infection, pain, bleeding, bruising, swelling, allergic reaction, paralysis of nearby muscles, eyelid droop, double vision, neck weakness, difficulty breathing, headache, undesirable cosmetic result, and need for additional treatment) and benefits of the procedure, as well as the alternatives.  Informed consent was obtained.  Preparation: The area was cleansed with alcohol.  Procedure Details:  Botox was injected into the dermis with a 30-gauge needle. Pressure applied to any bleeding. Ice packs offered for swelling.  Lot Number:  [id number] Expiration:  03/26  Total Units Injected:  56  Plan: Tylenol may be used for headache.  Allow 2 weeks before returning to clinic for additional dosing as needed. Patient will call for any problems.  Return in about 4 months (around 06/03/2023) for botox injections.  Maylene Roes, CMA, am acting as scribe for Darden Dates, MD .  Documentation: I have reviewed the above documentation for accuracy and completeness, and I agree with the above.  Darden Dates, MD

## 2023-02-01 NOTE — Patient Instructions (Signed)
Due to recent changes in healthcare laws, you may see results of your pathology and/or laboratory studies on MyChart before the doctors have had a chance to review them. We understand that in some cases there may be results that are confusing or concerning to you. Please understand that not all results are received at the same time and often the doctors may need to interpret multiple results in order to provide you with the best plan of care or course of treatment. Therefore, we ask that you please give us 2 business days to thoroughly review all your results before contacting the office for clarification. Should we see a critical lab result, you will be contacted sooner.   If You Need Anything After Your Visit  If you have any questions or concerns for your doctor, please call our main line at [phone] and press option 4 to reach your doctor's medical assistant. If no one answers, please leave a voicemail as directed and we will return your call as soon as possible. Messages left after 4 pm will be answered the following business day.   You may also send us a message via MyChart. We typically respond to MyChart messages within 1-2 business days.  For prescription refills, please ask your pharmacy to contact our office. Our fax number is [phone].  If you have an urgent issue when the clinic is closed that cannot wait until the next business day, you can page your doctor at the number below.    Please note that while we do our best to be available for urgent issues outside of office hours, we are not available 24/7.   If you have an urgent issue and are unable to reach us, you may choose to seek medical care at your doctor's office, retail clinic, urgent care center, or emergency room.  If you have a medical emergency, please immediately call 911 or go to the emergency department.  Pager Numbers  - Dr. Kowalski: [phone]  - Dr. Moye: [phone]  - Dr. Stewart:  [phone]  In the event of inclement weather, please call our main line at [phone] for an update on the status of any delays or closures.  Dermatology Medication Tips: Please keep the boxes that topical medications come in in order to help keep track of the instructions about where and how to use these. Pharmacies typically print the medication instructions only on the boxes and not directly on the medication tubes.   If your medication is too expensive, please contact our office at [phone] option 4 or send us a message through MyChart.   We are unable to tell what your co-pay for medications will be in advance as this is different depending on your insurance coverage. However, we may be able to find a substitute medication at lower cost or fill out paperwork to get insurance to cover a needed medication.   If a prior authorization is required to get your medication covered by your insurance company, please allow us 1-2 business days to complete this process.  Drug prices often vary depending on where the prescription is filled and some pharmacies may offer cheaper prices.  The website www.goodrx.com contains coupons for medications through different pharmacies. The prices here do not account for what the cost may be with help from insurance (it may be cheaper with your insurance), but the website can give you the price if you did not use any insurance.  - You can print the associated coupon and take it with   your prescription to the pharmacy.  - You may also stop by our office during regular business hours and pick up a GoodRx coupon card.  - If you need your prescription sent electronically to a different pharmacy, notify our office through [facility] MyChart or by phone at [phone] option 4.     Si Usted Necesita Algo Despus de Su Visita  Tambin puede enviarnos un mensaje a travs de MyChart. Por lo general respondemos a los mensajes de MyChart en el transcurso de 1 a 2  das hbiles.  Para renovar recetas, por favor pida a su farmacia que se ponga en contacto con nuestra oficina. Nuestro nmero de fax es el [phone].  Si tiene un asunto urgente cuando la clnica est cerrada y que no puede esperar hasta el siguiente da hbil, puede llamar/localizar a su doctor(a) al nmero que aparece a continuacin.   Por favor, tenga en cuenta que aunque hacemos todo lo posible para estar disponibles para asuntos urgentes fuera del horario de oficina, no estamos disponibles las 24 horas del da, los 7 das de la semana.   Si tiene un problema urgente y no puede comunicarse con nosotros, puede optar por buscar atencin mdica  en el consultorio de su doctor(a), en una clnica privada, en un centro de atencin urgente o en una sala de emergencias.  Si tiene una emergencia mdica, por favor llame inmediatamente al 911 o vaya a la sala de emergencias.  Nmeros de bper  - Dr. Kowalski: [phone]  - Dra. Moye: [phone]  - Dra. Stewart: [phone]  En caso de inclemencias del tiempo, por favor llame a nuestra lnea principal al [phone] para una actualizacin sobre el estado de cualquier retraso o cierre.  Consejos para la medicacin en dermatologa: Por favor, guarde las cajas en las que vienen los medicamentos de uso tpico para ayudarle a seguir las instrucciones sobre dnde y cmo usarlos. Las farmacias generalmente imprimen las instrucciones del medicamento slo en las cajas y no directamente en los tubos del medicamento.   Si su medicamento es muy caro, por favor, pngase en contacto con nuestra oficina llamando al [phone] y presione la opcin 4 o envenos un mensaje a travs de MyChart.   No podemos decirle cul ser su copago por los medicamentos por adelantado ya que esto es diferente dependiendo de la cobertura de su seguro. Sin embargo, es posible que podamos encontrar un medicamento sustituto a menor costo o llenar un formulario para que el  seguro cubra el medicamento que se considera necesario.   Si se requiere una autorizacin previa para que su compaa de seguros cubra su medicamento, por favor permtanos de 1 a 2 das hbiles para completar este proceso.  Los precios de los medicamentos varan con frecuencia dependiendo del lugar de dnde se surte la receta y alguna farmacias pueden ofrecer precios ms baratos.  El sitio web www.goodrx.com tiene cupones para medicamentos de diferentes farmacias. Los precios aqu no tienen en cuenta lo que podra costar con la ayuda del seguro (puede ser ms barato con su seguro), pero el sitio web puede darle el precio si no utiliz ningn seguro.  - Puede imprimir el cupn correspondiente y llevarlo con su receta a la farmacia.  - Tambin puede pasar por nuestra oficina durante el horario de atencin regular y recoger una tarjeta de cupones de GoodRx.  - Si necesita que su receta se enve electrnicamente a una farmacia diferente, informe a nuestra oficina a travs de MyChart de [facility]   o por telfono llamando al [phone] y presione la opcin 4.  

## 2023-02-23 ENCOUNTER — Other Ambulatory Visit: Payer: Self-pay

## 2023-03-05 ENCOUNTER — Other Ambulatory Visit: Payer: Self-pay

## 2023-05-15 ENCOUNTER — Ambulatory Visit: Payer: Self-pay | Admitting: Dermatology

## 2023-07-05 ENCOUNTER — Ambulatory Visit: Payer: 59 | Admitting: Dermatology

## 2023-10-04 ENCOUNTER — Other Ambulatory Visit: Payer: Self-pay | Admitting: Surgery

## 2023-10-04 MED ORDER — MUPIROCIN 2 % EX OINT: 1.0000 | TOPICAL_OINTMENT | Freq: Two times a day (BID) | CUTANEOUS | 0 refills | Status: AC

## 2024-01-09 ENCOUNTER — Ambulatory Visit: Payer: 59 | Admitting: Dermatology

## 2024-01-09 ENCOUNTER — Encounter: Payer: Self-pay | Admitting: Dermatology

## 2024-01-09 DIAGNOSIS — W908XXA Exposure to other nonionizing radiation, initial encounter: Secondary | ICD-10-CM

## 2024-01-09 DIAGNOSIS — Z1283 Encounter for screening for malignant neoplasm of skin: Secondary | ICD-10-CM

## 2024-01-09 DIAGNOSIS — D492 Neoplasm of unspecified behavior of bone, soft tissue, and skin: Secondary | ICD-10-CM | POA: Diagnosis not present

## 2024-01-09 DIAGNOSIS — L821 Other seborrheic keratosis: Secondary | ICD-10-CM

## 2024-01-09 DIAGNOSIS — D2271 Melanocytic nevi of right lower limb, including hip: Secondary | ICD-10-CM

## 2024-01-09 DIAGNOSIS — L578 Other skin changes due to chronic exposure to nonionizing radiation: Secondary | ICD-10-CM

## 2024-01-09 DIAGNOSIS — L814 Other melanin hyperpigmentation: Secondary | ICD-10-CM

## 2024-01-09 DIAGNOSIS — Z86018 Personal history of other benign neoplasm: Secondary | ICD-10-CM

## 2024-01-09 DIAGNOSIS — D1801 Hemangioma of skin and subcutaneous tissue: Secondary | ICD-10-CM

## 2024-01-09 DIAGNOSIS — D485 Neoplasm of uncertain behavior of skin: Secondary | ICD-10-CM

## 2024-01-09 DIAGNOSIS — D229 Melanocytic nevi, unspecified: Secondary | ICD-10-CM

## 2024-01-09 NOTE — Patient Instructions (Addendum)

## 2024-01-09 NOTE — Progress Notes (Signed)
   Follow-Up Visit   Subjective  Wanda Bennett is a 38 y.o. female who presents for the following: Skin Cancer Screening and Full Body Skin Exam The patient presents for Total-Body Skin Exam (TBSE) for skin cancer screening and mole check. The patient has spots, moles and lesions to be evaluated, some may be new or changing and the patient may have concern these could be cancer.  The following portions of the chart were reviewed this encounter and updated as appropriate: medications, allergies, medical history  Review of Systems:  No other skin or systemic complaints except as noted in HPI or Assessment and Plan.  Objective  Well appearing patient in no apparent distress; mood and affect are within normal limits.  A full examination was performed including scalp, head, eyes, ears, nose, lips, neck, chest, axillae, abdomen, back, buttocks, bilateral upper extremities, bilateral lower extremities, hands, feet, fingers, toes, fingernails, and toenails. All findings within normal limits unless otherwise noted below.   Relevant physical exam findings are noted in the Assessment and Plan. R ant thigh Irregular brown macule 0.8 cm   Assessment & Plan   SKIN CANCER SCREENING PERFORMED TODAY.  ACTINIC DAMAGE - Chronic condition, secondary to cumulative UV/sun exposure - diffuse scaly erythematous macules with underlying dyspigmentation - Recommend daily broad spectrum sunscreen SPF 30+ to sun-exposed areas, reapply every 2 hours as needed.  - Staying in the shade or wearing long sleeves, sun glasses (UVA+UVB protection) and wide brim hats (4-inch brim around the entire circumference of the hat) are also recommended for sun protection.  - Call for new or changing lesions.  LENTIGINES, SEBORRHEIC KERATOSES, HEMANGIOMAS - Benign normal skin lesions - Benign-appearing - Call for any changes  MELANOCYTIC NEVI -             - Tan-brown and/or pink-flesh-colored symmetric macules and  papules - Benign appearing on exam today - Observation - Call clinic for new or changing moles - Recommend daily use of broad spectrum spf 30+ sunscreen to sun-exposed areas.   HISTORY OF DYSPLASTIC NEVI No evidence of recurrence today Recommend regular full body skin exams Recommend daily broad spectrum sunscreen SPF 30+ to sun-exposed areas, reapply every 2 hours as needed.  Call if any new or changing lesions are noted between office visits   NEOPLASM OF UNCERTAIN BEHAVIOR OF SKIN R ant thigh Epidermal / dermal shaving  Lesion diameter (cm):  0.8 Informed consent: discussed and consent obtained   Timeout: patient name, date of birth, surgical site, and procedure verified   Procedure prep:  Patient was prepped and draped in usual sterile fashion Prep type:  Isopropyl alcohol Anesthesia: the lesion was anesthetized in a standard fashion   Anesthetic:  1% lidocaine w/ epinephrine 1-100,000 buffered w/ 8.4% NaHCO3 Instrument used: flexible razor blade   Hemostasis achieved with: pressure, aluminum chloride and electrodesiccation   Outcome: patient tolerated procedure well   Post-procedure details: sterile dressing applied and wound care instructions given   Dressing type: bandage and petrolatum   Specimen 1 - Surgical pathology Differential Diagnosis: D48.5 r/o dysplastic nevus Check Margins: Yes  Return in about 1 year (around 01/08/2025) for TBSE.  Maylene Roes, CMA, am acting as scribe for Armida Sans, MD .  Documentation: I have reviewed the above documentation for accuracy and completeness, and I agree with the above.  Armida Sans, MD

## 2024-01-10 MED ORDER — TRETINOIN 0.025 % EX CREA: TOPICAL_CREAM | Freq: Every day | CUTANEOUS | 6 refills | Status: AC

## 2024-01-11 LAB — SURGICAL PATHOLOGY

## 2024-01-14 ENCOUNTER — Encounter: Payer: Self-pay | Admitting: Dermatology

## 2024-01-15 ENCOUNTER — Telehealth: Payer: Self-pay

## 2024-01-15 NOTE — Telephone Encounter (Addendum)
 Discussed results with patient. She verbalized understanding. Will recheck at next follow up.   ----- Message from Wanda Bennett sent at 01/14/2024  5:33 PM EDT ----- FINAL DIAGNOSIS        1. Skin, R ant thigh :       DYSPLASTIC COMPOUND NEVUS WITH MODERATE ATYPIA, DEEP MARGIN INVOLVED   Moderate dysplastic Recheck next visit

## 2025-01-08 ENCOUNTER — Ambulatory Visit: Admitting: Dermatology
